# Patient Record
Sex: Female | Born: 1960 | Race: White | Hispanic: No | Marital: Married | Smoking: Former smoker
Health system: Southern US, Community
[De-identification: ages and names within clinical notes are randomized; demographics above are authoritative.]

## PROBLEM LIST (undated history)

## (undated) DIAGNOSIS — K259 Gastric ulcer, unspecified as acute or chronic, without hemorrhage or perforation: Secondary | ICD-10-CM

## (undated) DIAGNOSIS — K5790 Diverticulosis of intestine, part unspecified, without perforation or abscess without bleeding: Secondary | ICD-10-CM

## (undated) DIAGNOSIS — E079 Disorder of thyroid, unspecified: Secondary | ICD-10-CM

## (undated) DIAGNOSIS — I350 Nonrheumatic aortic (valve) stenosis: Secondary | ICD-10-CM

## (undated) DIAGNOSIS — A6920 Lyme disease, unspecified: Secondary | ICD-10-CM

## (undated) DIAGNOSIS — F419 Anxiety disorder, unspecified: Secondary | ICD-10-CM

## (undated) DIAGNOSIS — M1990 Unspecified osteoarthritis, unspecified site: Secondary | ICD-10-CM

## (undated) DIAGNOSIS — E7800 Pure hypercholesterolemia, unspecified: Secondary | ICD-10-CM

## (undated) DIAGNOSIS — E539 Vitamin B deficiency, unspecified: Secondary | ICD-10-CM

## (undated) DIAGNOSIS — M341 CR(E)ST syndrome: Secondary | ICD-10-CM

## (undated) HISTORY — DX: Gastric ulcer, unspecified as acute or chronic, without hemorrhage or perforation: K25.9

## (undated) HISTORY — DX: Pure hypercholesterolemia, unspecified: E78.00

## (undated) HISTORY — DX: Diverticulosis of intestine, part unspecified, without perforation or abscess without bleeding: K57.90

## (undated) HISTORY — DX: Vitamin B deficiency, unspecified: E53.9

## (undated) HISTORY — DX: Anxiety disorder, unspecified: F41.9

## (undated) HISTORY — DX: Nonrheumatic aortic (valve) stenosis: I35.0

## (undated) HISTORY — DX: Lyme disease, unspecified: A69.20

## (undated) HISTORY — DX: Unspecified osteoarthritis, unspecified site: M19.90

## (undated) HISTORY — DX: Cr(e)st syndrome: M34.1

## (undated) HISTORY — PX: TOTAL SHOULDER ARTHROPLASTY: SHX126

## (undated) HISTORY — PX: KNEE ARTHROPLASTY: SHX992

---

## 2002-07-19 HISTORY — PX: OOPHORECTOMY: SHX86

## 2005-07-19 HISTORY — PX: KNEE SURGERY: SHX244

## 2011-12-04 ENCOUNTER — Emergency Department
Admit: 2011-12-04 | Discharge: 2011-12-04 | Disposition: A | Discharge status: Home / Self Care | Payer: BC Managed Care – PPO

## 2011-12-04 ENCOUNTER — Encounter: Payer: Self-pay | Admitting: *Deleted

## 2011-12-04 ENCOUNTER — Emergency Department (INDEPENDENT_AMBULATORY_CARE_PROVIDER_SITE_OTHER)
Admission: EM | Admit: 2011-12-04 | Discharge: 2011-12-04 | Disposition: A | Discharge status: Home / Self Care | Payer: BC Managed Care – PPO | Source: Home / Self Care | Attending: Emergency Medicine | Admitting: Emergency Medicine

## 2011-12-04 DIAGNOSIS — M533 Sacrococcygeal disorders, not elsewhere classified: Secondary | ICD-10-CM

## 2011-12-04 NOTE — ED Provider Notes (Signed)
History     CSN: [id number]  Arrival date & time 12/04/11  1051   First MD Initiated Contact with Patient 12/04/11 1059      No chief complaint on file.   (Consider location/radiation/quality/duration/timing/severity/associated sxs/prior treatment) HPI Patient presents today with tailbone pain.  She fell off a horse and landed on her butt.  She felt some pain.  She is postmenopausal.  This has happened in the past and she had x-rays which turned out to be negative.  She recalls at that soreness did last for a little bit.  She has been using ibuprofen and Tylenol which has been helping.  She describes it as a soreness around her tailbone and in her buttock area.  She's been up around normally and walk, however standing up from sitting does cause some discomfort.  It is constant.  No past medical history on file.  No past surgical history on file.  No family history on file.  History  Substance Use Topics  . Smoking status: Not on file  . Smokeless tobacco: Not on file  . Alcohol Use: Not on file    OB History    No data available      Review of Systems  All other systems reviewed and are negative.    Allergies  Review of patient's allergies indicates not on file.  Home Medications  No current outpatient prescriptions on file.  There were no vitals taken for this visit.  Physical Exam  Nursing note and vitals reviewed. Constitutional: She is oriented to person, place, and time. She appears well-developed and well-nourished.  HENT:  Head: Normocephalic and atraumatic.  Eyes: No scleral icterus.  Neck: Neck supple.  Cardiovascular: Regular rhythm and normal heart sounds.   Pulmonary/Chest: Effort normal and breath sounds normal. No respiratory distress.  Musculoskeletal:       Lumbar back: She exhibits bony tenderness and pain. She exhibits normal range of motion, no tenderness, no edema, no deformity, no laceration and no spasm.       SLR negative.  Pain to  palpation sacral area and mildly bilateral sciatic notch.    Neurological: She is alert and oriented to person, place, and time.  Skin: Skin is warm and dry.  Psychiatric: She has a normal mood and affect. Her speech is normal.    ED Course  Procedures (including critical care time)  Labs Reviewed - No data to display Dg Sacrum/coccyx  12/04/2011  *RADIOLOGY REPORT*  Clinical Data: Fall from horse.  Sacral pain.  SACRUM AND COCCYX - 2+ VIEW  Comparison: None.  Findings: The sacrum and coccyx are intact.  No acute bone or soft tissue abnormality is present.  Mild degenerative changes are noted in the lumbar spine at L4-5 and L5-S1.  IMPRESSION:  1.  No acute abnormality of the sacrum and coccyx. 2.  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  Original Report Authenticated By: Jamesetta Orleans. MATTERN, M.D.     1. Coccydynia       MDM   An x-ray was obtained and read by the radiologist as above.  I discussed the diagnosis of coccydynia patient and explained the treatment protocol.  She can continue with her ibuprofen and Tylenol, continue to ice, consider a soft padded seat or donut.  If the pain has lingered long time, she can followup with pain management who may consider doing a injection and/or referral to PT.  Marlaine Hind, MD 12/04/11 1154

## 2011-12-11 ENCOUNTER — Emergency Department (INDEPENDENT_AMBULATORY_CARE_PROVIDER_SITE_OTHER)
Admission: EM | Admit: 2011-12-11 | Discharge: 2011-12-11 | Disposition: A | Discharge status: Home / Self Care | Payer: BC Managed Care – PPO | Source: Home / Self Care

## 2011-12-11 ENCOUNTER — Encounter: Payer: Self-pay | Admitting: Emergency Medicine

## 2011-12-11 DIAGNOSIS — N390 Urinary tract infection, site not specified: Secondary | ICD-10-CM

## 2011-12-11 HISTORY — DX: Disorder of thyroid, unspecified: E07.9

## 2011-12-11 LAB — POCT URINALYSIS DIP (MANUAL ENTRY)
Nitrite, UA: POSITIVE
Protein Ur, POC: 30
Urobilinogen, UA: 0.2 (ref 0–1)
pH, UA: 6 (ref 5–8)

## 2011-12-11 MED ORDER — CEPHALEXIN 500 MG PO CAPS: 500.0000 mg | ORAL_CAPSULE | Freq: Three times a day (TID) | ORAL | Status: AC

## 2011-12-11 NOTE — Discharge Instructions (Signed)

## 2011-12-11 NOTE — ED Provider Notes (Signed)
History     CSN: [id number]  Arrival date & time 12/11/11  1158   First MD Initiated Contact with Patient 12/11/11 1235      Chief Complaint  Patient presents with  . Dysuria   Patient is a 51 y.o. female presenting with dysuria. The history is provided by the patient.  Dysuria  This is a new problem. The current episode started yesterday. The problem occurs every urination. The problem has not changed since onset.The quality of the pain is described as burning. The pain is mild. There has been no fever. She is sexually active. There is no history of pyelonephritis. Associated symptoms include frequency, hesitancy and urgency. Pertinent negatives include no chills, no sweats, no nausea, no vomiting, no discharge, no hematuria and no flank pain. She has tried nothing for the symptoms. Her past medical history does not include kidney stones, single kidney or recurrent UTIs.    Past Medical History  Diagnosis Date  . Thyroid disease     Past Surgical History  Procedure Date  . Knee surgery   . Oophorectomy     History reviewed. No pertinent family history.  History  Substance Use Topics  . Smoking status: Never Smoker   . Smokeless tobacco: Not on file  . Alcohol Use: Yes    OB History    Grav Para Term Preterm Abortions TAB SAB Ect Mult Living                  Review of Systems  Constitutional: Negative for chills.  Gastrointestinal: Negative for nausea and vomiting.  Genitourinary: Positive for dysuria, hesitancy, urgency and frequency. Negative for hematuria and flank pain.    Allergies  Celebrex and Vioxx  Home Medications   Current Outpatient Rx  Name Route Sig Dispense Refill  . CEPHALEXIN 500 MG PO CAPS Oral Take 1 capsule (500 mg total) by mouth 3 (three) times daily. 21 capsule 0  . ESTROGENS CONJ SYNTHETIC A 0.3 MG PO TABS Oral Take 0.3 mg by mouth daily. Specific dosage unknown    . LEVOTHYROXINE SODIUM 150 MCG PO TABS Oral Take 150 mcg by mouth  daily.    Marland Kitchen LIOTHYRONINE SODIUM 25 MCG PO TABS Oral Take 25 mcg by mouth daily.      BP 156/86  Pulse 72  Temp(Src) 97.4 F (36.3 C) (Oral)  Resp 16  SpO2 98%  Physical Exam  Constitutional: She appears well-developed.  HENT:  Head: Normocephalic and atraumatic.  Mouth/Throat: Oropharynx is clear and moist.  Eyes: EOM are normal. Pupils are equal, round, and reactive to light.  Neck: Normal range of motion. Neck supple.  Cardiovascular: Normal rate and regular rhythm.   Pulmonary/Chest: Effort normal and breath sounds normal. She exhibits tenderness.  Abdominal: Soft. Bowel sounds are normal.       + suprapubic tenderness to palpation   Musculoskeletal: Normal range of motion.  Neurological: She is alert.  Skin: Skin is warm.    ED Course  Procedures (including critical care time)   Labs Reviewed  POCT URINALYSIS DIP (MANUAL ENTRY)  URINE CULTURE   No results found.   No diagnosis found.    MDM  Will treat for UTI.  Keflex x 7 days  Urine culture Discussed infectious red flags.  Handout given.  Follow up as needed.      The patient and/or caregiver has been counseled thoroughly with regard to treatment plan and/or medications prescribed including dosage, schedule, interactions, rationale for use, and possible  side effects and they verbalize understanding. Diagnoses and expected course of recovery discussed and will return if not improved as expected or if the condition worsens. Patient and/or caregiver verbalized understanding.             Floydene Flock, MD 12/11/11 1249

## 2011-12-11 NOTE — ED Provider Notes (Signed)
Agree with exam, assessment, and plan.   Lattie Haw, MD 12/11/11 [phone]

## 2011-12-11 NOTE — ED Notes (Signed)
Dysuria x 'couple days'. Recently treated for musculo-skeletal injury.

## 2011-12-14 ENCOUNTER — Telehealth: Payer: Self-pay | Admitting: *Deleted

## 2011-12-14 NOTE — ED Notes (Signed)
Spoke to pt given urine culture results. She states that she is feeling better. Advised her to complete all of the ABT and to f/u with her PCP if she fails to improve or worsens. Pt agrees.

## 2012-01-20 ENCOUNTER — Encounter: Payer: Self-pay | Admitting: *Deleted

## 2012-01-20 ENCOUNTER — Emergency Department (INDEPENDENT_AMBULATORY_CARE_PROVIDER_SITE_OTHER)
Admission: EM | Admit: 2012-01-20 | Discharge: 2012-01-20 | Disposition: A | Discharge status: Home / Self Care | Payer: BC Managed Care – PPO | Source: Home / Self Care | Attending: Family Medicine | Admitting: Family Medicine

## 2012-01-20 DIAGNOSIS — R300 Dysuria: Secondary | ICD-10-CM

## 2012-01-20 DIAGNOSIS — N3000 Acute cystitis without hematuria: Secondary | ICD-10-CM

## 2012-01-20 LAB — POCT URINALYSIS DIP (MANUAL ENTRY)
Bilirubin, UA: NEGATIVE
Ketones, POC UA: NEGATIVE
Spec Grav, UA: 1.01 (ref 1.005–1.03)
pH, UA: 6 (ref 5–8)

## 2012-01-20 MED ORDER — CIPROFLOXACIN HCL 250 MG PO TABS: 250.0000 mg | ORAL_TABLET | Freq: Two times a day (BID) | ORAL | Status: AC

## 2012-01-20 NOTE — ED Provider Notes (Signed)
History     CSN: [id number]  Arrival date & time 01/20/12  4098   First MD Initiated Contact with Patient 01/20/12 [phone]      Chief Complaint  Patient presents with  . Dysuria      HPI Comments: Patient notes that previous UTI symptoms resolved.  Now has recurrent symptoms for two days.  Patient is a 51 y.o. female presenting with dysuria. The history is provided by the patient.  Dysuria  This is a recurrent problem. The current episode started yesterday. The problem occurs every urination. The problem has not changed since onset.The quality of the pain is described as burning. The pain is at a severity of 0/10. The pain is mild. There has been no fever. There is no history of pyelonephritis. Associated symptoms include frequency, hesitancy and urgency. Pertinent negatives include no chills, no sweats, no nausea, no vomiting, no discharge, no hematuria, no possible pregnancy and no flank pain. She has tried nothing for the symptoms. Her past medical history does not include recurrent UTIs.    Past Medical History  Diagnosis Date  . Thyroid disease     Past Surgical History  Procedure Date  . Knee surgery   . Oophorectomy     History reviewed. No pertinent family history.  History  Substance Use Topics  . Smoking status: Never Smoker   . Smokeless tobacco: Not on file  . Alcohol Use: Yes    OB History    Grav Para Term Preterm Abortions TAB SAB Ect Mult Living                  Review of Systems  Constitutional: Negative for chills.  Gastrointestinal: Negative for nausea and vomiting.  Genitourinary: Positive for dysuria, hesitancy, urgency and frequency. Negative for hematuria and flank pain.  All other systems reviewed and are negative.    Allergies  Celebrex and Vioxx  Home Medications   Current Outpatient Rx  Name Route Sig Dispense Refill  . CIPROFLOXACIN HCL 250 MG PO TABS Oral Take 1 tablet (250 mg total) by mouth 2 (two) times daily. 10 tablet 0  .  ESTROGENS CONJ SYNTHETIC A 0.3 MG PO TABS Oral Take 0.3 mg by mouth daily. Specific dosage unknown    . LEVOTHYROXINE SODIUM 150 MCG PO TABS Oral Take 150 mcg by mouth daily.    Marland Kitchen LIOTHYRONINE SODIUM 25 MCG PO TABS Oral Take 25 mcg by mouth daily.      BP 129/80  Pulse 68  Temp 97.7 F (36.5 C) (Oral)  Ht 5\' 7"  (1.702 m)  Wt 178 lb 12.8 oz (81.103 kg)  BMI 28.00 kg/m2  SpO2 100%  Physical Exam Nursing notes and Vital Signs reviewed. Appearance:  Patient appears healthy, stated age, and in no acute distress Eyes:  Pupils are equal, round, and reactive to light and accomodation.  Extraocular movement is intact.  Conjunctivae are not inflamed   Mouth/Pharynx:  Normal; moist mucous membranes  Neck:  Supple.  No adenopathy Lungs:  Clear to auscultation.  Breath sounds are equal.  Heart:  Regular rate and rhythm without murmurs, rubs, or gallops.  Abdomen:   Mild tenderness over bladder without masses or hepatosplenomegaly.  Bowel sounds are present.  No CVA or flank tenderness.  Skin:  No rash present.   ED Course  Procedures none   Labs Reviewed  POCT URINALYSIS DIP (MANUAL ENTRY):  Small blood, small leuks, otherwise negative  URINE CULTURE pending      1.  Dysuria   2. Acute cystitis, recurrent       MDM  Previous cystitis with e. Coli sensitive to all.  Since recurrent, will treat with Cipro for 5 days. Urine culture pending. Increase fluid intake.  May take OTC Azo for 2 to 3 days.  Avoid sun exposure while taking antibiotic Followup with Family Doctor if not improved in one week.         Lattie Haw, MD 01/20/12 [phone]

## 2012-01-20 NOTE — ED Notes (Signed)
Patient c/o dysuria and urinary frequency x last night. Denies hematuria. No otc meds taken.

## 2012-01-22 ENCOUNTER — Telehealth: Payer: Self-pay | Admitting: Family Medicine

## 2012-03-09 ENCOUNTER — Encounter: Payer: Self-pay | Admitting: *Deleted

## 2012-03-09 ENCOUNTER — Emergency Department (INDEPENDENT_AMBULATORY_CARE_PROVIDER_SITE_OTHER)
Admission: EM | Admit: 2012-03-09 | Discharge: 2012-03-09 | Disposition: A | Discharge status: Home / Self Care | Payer: BC Managed Care – PPO | Source: Home / Self Care | Attending: Emergency Medicine | Admitting: Emergency Medicine

## 2012-03-09 DIAGNOSIS — J0100 Acute maxillary sinusitis, unspecified: Secondary | ICD-10-CM

## 2012-03-09 MED ORDER — AMOXICILLIN 875 MG PO TABS: 875.0000 mg | ORAL_TABLET | Freq: Two times a day (BID) | ORAL | Status: AC

## 2012-03-09 MED ORDER — FLUTICASONE PROPIONATE 50 MCG/ACT NA SUSP: NASAL | Status: DC

## 2012-03-09 NOTE — ED Notes (Signed)
Patient c/o HA, sinus pain/pressure and congestion x 3-4 days. Taken Claritin and Mucinex D

## 2012-03-09 NOTE — ED Provider Notes (Signed)
History     CSN: [id number]  Arrival date & time 03/09/12  0804   First MD Initiated Contact with Patient 03/09/12 [phone]      Chief Complaint  Patient presents with  . Facial Pain  . Headache     HPI SINUSITIS  Onset: 3-4 days Facial/sinus pressure with discolored nasal mucus.    Severity: moderate Tried OTC meds without significant relief.  Symptoms:  + mild Fever , chills + URI prodrome with nasal congestion + Minimal swollen neck glands + moderate Sinus Headache + minimal ear pressure  + Allergy symptoms No significant Sore Throat No eye symptoms     No significant Cough No chest pain No shortness of breath  No wheezing  No Abdominal Pain No Nausea No Vomiting No diarrhea  No Myalgias No focal neurologic symptoms No syncope No Rash  No Urinary symptoms          Past Medical History  Diagnosis Date  . Thyroid disease     Past Surgical History  Procedure Date  . Knee surgery   . Oophorectomy     History reviewed. No pertinent family history.  History  Substance Use Topics  . Smoking status: Never Smoker   . Smokeless tobacco: Not on file  . Alcohol Use: Yes    OB History    Grav Para Term Preterm Abortions TAB SAB Ect Mult Living                  Review of Systems  All other systems reviewed and are negative.    Allergies  Celebrex and Vioxx  Home Medications   Current Outpatient Rx  Name Route Sig Dispense Refill  . ESTROGENS CONJ SYNTHETIC A 0.3 MG PO TABS Oral Take 0.3 mg by mouth daily. Specific dosage unknown    . LEVOTHYROXINE SODIUM 150 MCG PO TABS Oral Take 150 mcg by mouth daily.    Marland Kitchen LIOTHYRONINE SODIUM 25 MCG PO TABS Oral Take 25 mcg by mouth daily.      BP 120/80  Pulse 77  Temp 98.1 F (36.7 C) (Oral)  Resp 14  Ht 5\' 7"  (1.702 m)  Wt 174 lb (78.926 kg)  BMI 27.25 kg/m2  SpO2 95%  Physical Exam  Nursing note and vitals reviewed. Constitutional: She is oriented to person, place, and time. She  appears well-developed and well-nourished. No distress.  HENT:  Head: Normocephalic and atraumatic.  Right Ear: Tympanic membrane, external ear and ear canal normal.  Left Ear: Tympanic membrane, external ear and ear canal normal.  Nose: Mucosal edema and rhinorrhea present. Right sinus exhibits maxillary sinus tenderness. Left sinus exhibits maxillary sinus tenderness.  Mouth/Throat: Oropharynx is clear and moist. No oral lesions. No oropharyngeal exudate.  Eyes: Right eye exhibits no discharge. Left eye exhibits no discharge. No scleral icterus.  Neck: Neck supple.  Cardiovascular: Normal rate, regular rhythm and normal heart sounds.   Pulmonary/Chest: Effort normal and breath sounds normal. She has no wheezes. She has no rales.  Lymphadenopathy:    She has no cervical adenopathy.  Neurological: She is alert and oriented to person, place, and time.  Skin: Skin is warm and dry.    ED Course  Procedures (including critical care time)    MDM  Acute maxillary sinusitis with allergy component. Questions invited and answered. Risks, benefits, alternatives discussed. Patient agrees with the following plans: Amoxicillin 875 twice a day for 10 days. Flonase. Precautions discussed. Begin Antibiotic as directed. Take Mucinex-D (guaifenesin with  decongestant) twice daily for congestion. (If your blood pressure is high, then take plain Mucinex twice daily.)  Increase fluid intake, rest. May use Afrin nasal spray (or generic oxymetazoline) twice daily for about 5 days.  Also recommend using saline nasal spray several times daily and/or saline nasal irrigation. Stop all antihistamines for now, and other non-prescription cough/cold preparations. May resume Claritin when congestion is improving. Begin fluticasone (Flonase) nasal spray as prescribed.  Suggest using the fluticasone right after using Afrin and saline irrigation. Follow-up with family doctor if not improving 7 to 10 days, sooner if  worse or new symptoms.         Lajean Manes, MD 03/09/12 [phone]

## 2012-03-19 HISTORY — PX: SHOULDER SURGERY: SHX246

## 2012-03-21 ENCOUNTER — Encounter: Payer: Self-pay | Admitting: *Deleted

## 2012-03-21 ENCOUNTER — Emergency Department (INDEPENDENT_AMBULATORY_CARE_PROVIDER_SITE_OTHER)
Admission: EM | Admit: 2012-03-21 | Discharge: 2012-03-21 | Disposition: A | Discharge status: Home / Self Care | Payer: BC Managed Care – PPO | Source: Home / Self Care | Attending: Family Medicine | Admitting: Family Medicine

## 2012-03-21 ENCOUNTER — Emergency Department
Admission: EM | Admit: 2012-03-21 | Discharge: 2012-03-21 | Discharge status: Absent without leave | Payer: BC Managed Care – PPO | Source: Home / Self Care

## 2012-03-21 DIAGNOSIS — N3000 Acute cystitis without hematuria: Secondary | ICD-10-CM

## 2012-03-21 LAB — POCT URINALYSIS DIP (MANUAL ENTRY)
Protein Ur, POC: NEGATIVE
Spec Grav, UA: 1.015 (ref 1.005–1.03)
Urobilinogen, UA: 0.2 (ref 0–1)

## 2012-03-21 MED ORDER — NITROFURANTOIN MONOHYD MACRO 100 MG PO CAPS: 100.0000 mg | ORAL_CAPSULE | Freq: Two times a day (BID) | ORAL | Status: AC

## 2012-03-21 NOTE — ED Notes (Signed)
Pt c/o dysuria and frequent urination x this morning. Denies fever. No OTC meds.

## 2012-03-21 NOTE — ED Provider Notes (Signed)
History     CSN: [id number]  Arrival date & time 03/21/12  1615   First MD Initiated Contact with Patient 03/21/12 1642      Chief Complaint  Patient presents with  . Dysuria      HPI Comments: Pt c/o dysuria and frequent urination x this morning. Denies fever. No OTC meds.  Patient is a 51 y.o. female presenting with dysuria. The history is provided by the patient.  Dysuria  This is a new problem. The current episode started 6 to 12 hours ago. The problem occurs every urination. The problem has been rapidly worsening. The quality of the pain is described as burning. The pain is mild. There has been no fever. Associated symptoms include frequency, hesitancy and urgency. Pertinent negatives include no chills, no sweats, no nausea, no vomiting, no discharge, no hematuria and no flank pain. She has tried nothing for the symptoms.    Past Medical History  Diagnosis Date  . Thyroid disease     Past Surgical History  Procedure Date  . Knee surgery   . Oophorectomy     History reviewed. No pertinent family history.  History  Substance Use Topics  . Smoking status: Never Smoker   . Smokeless tobacco: Not on file  . Alcohol Use: Yes    OB History    Grav Para Term Preterm Abortions TAB SAB Ect Mult Living                  Review of Systems  Constitutional: Negative for chills.  Gastrointestinal: Negative for nausea and vomiting.  Genitourinary: Positive for dysuria, hesitancy, urgency and frequency. Negative for hematuria and flank pain.  All other systems reviewed and are negative.    Allergies  Celebrex and Vioxx  Home Medications   Current Outpatient Rx  Name Route Sig Dispense Refill  . ESTROGENS CONJ SYNTHETIC A 0.3 MG PO TABS Oral Take 0.3 mg by mouth daily. Specific dosage unknown    . FLUTICASONE PROPIONATE 50 MCG/ACT NA SUSP  1 or 2 sprays each nostril twice a day 16 g 0  . LEVOTHYROXINE SODIUM 150 MCG PO TABS Oral Take 150 mcg by mouth daily.    Marland Kitchen  LIOTHYRONINE SODIUM 25 MCG PO TABS Oral Take 25 mcg by mouth daily.      BP 130/81  Pulse 73  Temp 97.7 F (36.5 C) (Oral)  Resp 16  Ht 5\' 7"  (1.702 m)  Wt 180 lb (81.647 kg)  BMI 28.19 kg/m2  SpO2 99%  Physical Exam Nursing notes and Vital Signs reviewed. Appearance:  Patient appears healthy, stated age, and in no acute distress Eyes:  Pupils are equal, round, and reactive to light and accomodation.  Extraocular movement is intact.  Conjunctivae are not inflamed  Pharynx:  Normal Neck:  Supple.  No adenopathy Lungs:  Clear to auscultation.  Breath sounds are equal.  Heart:  Regular rate and rhythm without murmurs, rubs, or gallops.  Abdomen:  Nontender without masses or hepatosplenomegaly.  Bowel sounds are present.  No CVA or flank tenderness.  Extremities:  No edema.  No calf tenderness Skin:  No rash present.   ED Course  Procedures none   Labs Reviewed  POCT URINALYSIS DIP (MANUAL ENTRY):  Moderate blood, moderate leuks  URINE CULTURE pending      1. Acute cystitis       MDM   Urine culture pending.  Begin Macrobid. Increase fluid intake.  If desired, may use AZO for urinary  discomfort (do not use more than two days). Followup with Family Doctor if not improved in one week.         Lattie Haw, MD 03/22/12 [phone]

## 2012-03-25 LAB — URINE CULTURE: Colony Count: 40000

## 2012-03-26 ENCOUNTER — Telehealth: Payer: Self-pay | Admitting: Emergency Medicine

## 2012-03-28 ENCOUNTER — Other Ambulatory Visit: Payer: Self-pay | Admitting: Orthopedic Surgery

## 2012-03-28 DIAGNOSIS — M25511 Pain in right shoulder: Secondary | ICD-10-CM

## 2012-03-30 ENCOUNTER — Ambulatory Visit
Admission: RE | Admit: 2012-03-30 | Discharge: 2012-03-30 | Disposition: A | Discharge status: Home / Self Care | Payer: BC Managed Care – PPO | Source: Ambulatory Visit | Attending: Orthopedic Surgery | Admitting: Orthopedic Surgery

## 2012-03-30 DIAGNOSIS — M25511 Pain in right shoulder: Secondary | ICD-10-CM

## 2012-07-08 ENCOUNTER — Encounter: Payer: Self-pay | Admitting: Emergency Medicine

## 2012-07-08 ENCOUNTER — Emergency Department (INDEPENDENT_AMBULATORY_CARE_PROVIDER_SITE_OTHER)
Admission: EM | Admit: 2012-07-08 | Discharge: 2012-07-08 | Disposition: A | Discharge status: Home / Self Care | Payer: BC Managed Care – PPO | Source: Home / Self Care | Attending: Family Medicine | Admitting: Family Medicine

## 2012-07-08 DIAGNOSIS — N390 Urinary tract infection, site not specified: Secondary | ICD-10-CM

## 2012-07-08 LAB — POCT URINALYSIS DIP (MANUAL ENTRY)
Ketones, POC UA: NEGATIVE
Protein Ur, POC: NEGATIVE
Spec Grav, UA: <=1.005
pH, UA: 5.5

## 2012-07-08 MED ORDER — CEPHALEXIN 500 MG PO CAPS: 500.0000 mg | ORAL_CAPSULE | Freq: Three times a day (TID) | ORAL | Status: DC

## 2012-07-08 NOTE — ED Provider Notes (Addendum)
History     CSN: [id number]  Arrival date & time 07/08/12  1132   None     Chief Complaint  Patient presents with  . Urinary Frequency   HPI  DYSURIA Onset:  3-4 days  Description: dysuria, increased urinary frequency Modifying factors: has had 3 UTIs thus far this year per pt.   Symptoms Urgency:  yes Frequency: yes  Hesitancy:  yes Hematuria:  no Flank Pain:  no Fever: no Nausea/Vomiting:  no Missed LMP: no STD exposure: no Discharge: no Irritants: no Rash: no  Red Flags   More than 3 UTI's last 12 months:  yes PMH of  Diabetes or Immunosuppression:  no Renal Disease/Calculi: no Urinary Tract Abnormality:  no Instrumentation or Trauma: no    Past Medical History  Diagnosis Date  . Thyroid disease     Past Surgical History  Procedure Date  . Knee surgery   . Oophorectomy     No family history on file.  History  Substance Use Topics  . Smoking status: Never Smoker   . Smokeless tobacco: Not on file  . Alcohol Use: Yes    OB History    Grav Para Term Preterm Abortions TAB SAB Ect Mult Living                  Review of Systems  All other systems reviewed and are negative.    Allergies  Celebrex and Vioxx  Home Medications   Current Outpatient Rx  Name  Route  Sig  Dispense  Refill  . ESTROGENS CONJ SYNTHETIC A 0.3 MG PO TABS   Oral   Take 0.3 mg by mouth daily. Specific dosage unknown         . FLUTICASONE PROPIONATE 50 MCG/ACT NA SUSP      1 or 2 sprays each nostril twice a day   16 g   0   . LEVOTHYROXINE SODIUM 150 MCG PO TABS   Oral   Take 150 mcg by mouth daily.         Marland Kitchen LIOTHYRONINE SODIUM 25 MCG PO TABS   Oral   Take 25 mcg by mouth daily.           BP 118/80  Pulse 80  Temp 98.1 F (36.7 C) (Oral)  Resp 16  Ht 5\' 7"  (1.702 m)  Wt 175 lb (79.379 kg)  BMI 27.41 kg/m2  SpO2 99%  Physical Exam  Constitutional: She is oriented to person, place, and time. She appears well-developed and  well-nourished.  HENT:  Head: Normocephalic and atraumatic.  Eyes: Conjunctivae normal are normal. Pupils are equal, round, and reactive to light.  Neck: Normal range of motion. Neck supple.  Cardiovascular: Normal rate, regular rhythm and normal heart sounds.   Pulmonary/Chest: Effort normal and breath sounds normal.  Abdominal: Soft.       No flank pain  + suprapubic tenderness    Musculoskeletal: Normal range of motion.  Neurological: She is alert and oriented to person, place, and time.  Skin: Skin is warm.    ED Course  Procedures (including critical care time)   Labs Reviewed  POCT URINALYSIS DIP (MANUAL ENTRY)   No results found.   1. Recurrent UTI       MDM  Will place on extended course of keflex for treatment.  Prior urine cultures reviewed.  Urine culture.  Urology referral as this has become a recurrent issue.  Discussed infectious and GU red flags.  The patient and/or caregiver has been counseled thoroughly with regard to treatment plan and/or medications prescribed including dosage, schedule, interactions, rationale for use, and possible side effects and they verbalize understanding. Diagnoses and expected course of recovery discussed and will return if not improved as expected or if the condition worsens. Patient and/or caregiver verbalized understanding.             Doree Albee, MD 07/08/12 1217  Doree Albee, MD 07/08/12 [phone]

## 2012-07-08 NOTE — ED Notes (Signed)
Reports urinary frequency and dysuria an 48 hours; this is 3 rd time this year with similar symptoms.

## 2012-07-10 ENCOUNTER — Telehealth: Payer: Self-pay | Admitting: *Deleted

## 2012-07-17 ENCOUNTER — Telehealth: Payer: Self-pay | Admitting: Emergency Medicine

## 2012-07-17 MED ORDER — CIPROFLOXACIN HCL 500 MG PO TABS: 500.0000 mg | ORAL_TABLET | Freq: Two times a day (BID) | ORAL | Status: DC

## 2013-03-20 ENCOUNTER — Other Ambulatory Visit: Payer: Self-pay | Admitting: Orthopedic Surgery

## 2013-03-20 DIAGNOSIS — M25561 Pain in right knee: Secondary | ICD-10-CM

## 2013-03-22 ENCOUNTER — Ambulatory Visit
Admission: RE | Admit: 2013-03-22 | Discharge: 2013-03-22 | Disposition: A | Discharge status: Home / Self Care | Payer: Self-pay | Source: Ambulatory Visit | Attending: Orthopedic Surgery | Admitting: Orthopedic Surgery

## 2013-03-22 DIAGNOSIS — M25561 Pain in right knee: Secondary | ICD-10-CM

## 2013-09-15 ENCOUNTER — Emergency Department (INDEPENDENT_AMBULATORY_CARE_PROVIDER_SITE_OTHER)
Admission: EM | Admit: 2013-09-15 | Discharge: 2013-09-15 | Disposition: A | Discharge status: Home / Self Care | Payer: BC Managed Care – PPO | Source: Home / Self Care | Attending: Family Medicine | Admitting: Family Medicine

## 2013-09-15 ENCOUNTER — Emergency Department (INDEPENDENT_AMBULATORY_CARE_PROVIDER_SITE_OTHER): Payer: BC Managed Care – PPO

## 2013-09-15 ENCOUNTER — Encounter: Payer: Self-pay | Admitting: Emergency Medicine

## 2013-09-15 DIAGNOSIS — J3489 Other specified disorders of nose and nasal sinuses: Secondary | ICD-10-CM

## 2013-09-15 DIAGNOSIS — J309 Allergic rhinitis, unspecified: Secondary | ICD-10-CM

## 2013-09-15 DIAGNOSIS — E079 Disorder of thyroid, unspecified: Secondary | ICD-10-CM | POA: Insufficient documentation

## 2013-09-15 DIAGNOSIS — R51 Headache: Secondary | ICD-10-CM

## 2013-09-15 MED ORDER — CEFDINIR 300 MG PO CAPS: 300.0000 mg | ORAL_CAPSULE | Freq: Two times a day (BID) | ORAL | Status: DC

## 2013-09-15 MED ORDER — PREDNISONE 20 MG PO TABS: 20.0000 mg | ORAL_TABLET | Freq: Two times a day (BID) | ORAL | Status: DC

## 2013-09-15 MED ORDER — FLUTICASONE PROPIONATE 50 MCG/ACT NA SUSP: NASAL | Status: DC

## 2013-09-15 NOTE — ED Notes (Signed)
Paula [name] complains of pressure in face, headaches and congestion for 1 month. She was seen last week by a PA and was treated with Augmentin 125 mg and prednisone. She is not getting better. She has taken Sudafed and Mucinex for her symptoms with some relief. She reports chills for a couple of days.

## 2013-09-15 NOTE — ED Provider Notes (Signed)
CSN: [id number]     Arrival date & time 09/15/13  1345 History   First MD Initiated Contact with Patient 09/15/13 1432     Chief Complaint  Patient presents with  . Facial Pain    x 1 month  . Headache    x 1 month      HPI Comments: Patient complains of persistent sinus congestion and facial pressure for one month.  She was evaluated for sinusitis one week ago and started on Augmentin and prednisone taper.  She felt somewhat better while taking prednisone but now symptoms are persistent.  She has had aching in her maxillary teeth.  She has had chills for several days.  The history is provided by the patient.    Past Medical History  Diagnosis Date  . Thyroid disease    Past Surgical History  Procedure Laterality Date  . Oophorectomy    . Knee surgery    . Shoulder surgery     History reviewed. No pertinent family history. History  Substance Use Topics  . Smoking status: Never Smoker   . Smokeless tobacco: Never Used  . Alcohol Use: Yes   OB History   Grav Para Term Preterm Abortions TAB SAB Ect Mult Living                 Review of Systems No sore throat No cough No pleuritic pain No wheezing + nasal congestion + post-nasal drainage + sinus pain/pressure No itchy/red eyes No earache No hemoptysis No SOB No fever, + chills No nausea No vomiting No abdominal pain No diarrhea No urinary symptoms No skin rash No fatigue No myalgias + headache Used OTC meds without relief  Allergies  Celebrex and Vioxx  Home Medications   Current Outpatient Rx  Name  Route  Sig  Dispense  Refill  . cefdinir (OMNICEF) 300 MG capsule   Oral   Take 1 capsule (300 mg total) by mouth 2 (two) times daily.   14 capsule   0   . ciprofloxacin (CIPRO) 500 MG tablet   Oral   Take 1 tablet (500 mg total) by mouth 2 (two) times daily.   10 tablet   0   . estrogens conjugated, synthetic A, (CENESTIN) 0.3 MG tablet   Oral   Take 0.3 mg by mouth daily. Specific dosage  unknown         . EXPIRED: fluticasone (FLONASE) 50 MCG/ACT nasal spray      1 or 2 sprays each nostril twice a day   16 g   0   . fluticasone (FLONASE) 50 MCG/ACT nasal spray      Place two sprays in each nostril once daily   16 g   1   . guaiFENesin (MUCINEX) 600 MG 12 hr tablet   Oral   Take by mouth 2 (two) times daily.         Marland Kitchen. levothyroxine (SYNTHROID, LEVOTHROID) 150 MCG tablet   Oral   Take 150 mcg by mouth daily.         Marland Kitchen. levothyroxine (SYNTHROID, LEVOTHROID) 75 MCG tablet   Oral   Take 75 mcg by mouth daily before breakfast.         . liothyronine (CYTOMEL) 25 MCG tablet   Oral   Take 25 mcg by mouth daily.         . predniSONE (DELTASONE) 20 MG tablet   Oral   Take 1 tablet (20 mg total) by mouth 2 (two) times  daily. Take with food.   10 tablet   0   . pseudoephedrine (SUDAFED) 30 MG tablet   Oral   Take 30 mg by mouth every 4 (four) hours as needed for congestion.          BP 157/87  Pulse 77  Temp(Src) 98 F (36.7 C) (Oral)  Ht 5\' 7"  (1.702 m)  Wt 181 lb (82.101 kg)  BMI 28.34 kg/m2  SpO2 99% Physical Exam Nursing notes and Vital Signs reviewed. Appearance:  Patient appears healthy, stated age, and in no acute distress Eyes:  Pupils are equal, round, and reactive to light and accomodation.  Extraocular movement is intact.  Conjunctivae are not inflamed  Ears:  Canals normal.  Tympanic membranes normal.  Nose:  Mildly congested turbinates.   Maxillary & frontal sinus tenderness is present.  Pharynx:  Normal Neck:  Supple.  Slightly tender shotty posterior nodes are palpated bilaterally  Lungs:  Clear to auscultation.  Breath sounds are equal.  Heart:  Regular rate and rhythm without murmurs, rubs, or gallops.  Skin:  No rash present.   ED Course  Procedures  none    Imaging Review Narrative: CLINICAL DATA: Slurred is congestion. Facial pain.  EXAM: PARANASAL SINUSES - COMPLETE 3 + VIEW  COMPARISON: None.  FINDINGS: The  paranasal sinus are aerated. There is no evidence of sinus opacification air-fluid levels or mucosal thickening. No significant bone abnormalities are seen.  IMPRESSION: Negative sinus radiographs.   Electronically Signed By: Gennette Pac M.D. On: 09/15/2013 15:18    MDM   1. Allergic rhinitis    Begin prednisone burst.  Begin empiric Omnicef.  Begin Flonase. May continue Claritin D Take plain Mucinex (1200 mg guaifenesin) twice daily for cough and congestion.  Increase fluid intake. May use Afrin nasal spray (or generic oxymetazoline) twice daily for about 5 days.  Also recommend using saline nasal spray several times daily and saline nasal irrigation (AYR is a common brand).  Use Flonase spray after Afrin spray and saline rinse. Followup with ENT if not improved one month.    Lattie Haw, MD 09/17/13 2053

## 2013-09-15 NOTE — Discharge Instructions (Signed)
May continue Claritin D Take plain Mucinex (1200 mg guaifenesin) twice daily for cough and congestion.  Increase fluid intake. May use Afrin nasal spray (or generic oxymetazoline) twice daily for about 5 days.  Also recommend using saline nasal spray several times daily and saline nasal irrigation (AYR is a common brand).  Use Flonase spray after Afrin spray and saline rinse.

## 2013-09-17 ENCOUNTER — Encounter: Payer: Self-pay | Admitting: Emergency Medicine

## 2013-09-26 ENCOUNTER — Encounter: Payer: Self-pay | Admitting: Gastroenterology

## 2013-10-15 ENCOUNTER — Other Ambulatory Visit: Payer: Self-pay | Admitting: Allergy

## 2013-10-15 ENCOUNTER — Other Ambulatory Visit: Payer: Self-pay

## 2013-10-15 DIAGNOSIS — J329 Chronic sinusitis, unspecified: Secondary | ICD-10-CM

## 2013-10-17 ENCOUNTER — Ambulatory Visit
Admission: RE | Admit: 2013-10-17 | Discharge: 2013-10-17 | Disposition: A | Discharge status: Home / Self Care | Payer: BC Managed Care – PPO | Source: Ambulatory Visit | Attending: Allergy | Admitting: Allergy

## 2013-10-17 DIAGNOSIS — J329 Chronic sinusitis, unspecified: Secondary | ICD-10-CM

## 2013-12-03 ENCOUNTER — Encounter (HOSPITAL_BASED_OUTPATIENT_CLINIC_OR_DEPARTMENT_OTHER): Payer: Self-pay | Admitting: Emergency Medicine

## 2013-12-03 ENCOUNTER — Emergency Department (HOSPITAL_BASED_OUTPATIENT_CLINIC_OR_DEPARTMENT_OTHER)
Admission: EM | Admit: 2013-12-03 | Discharge: 2013-12-03 | Disposition: A | Discharge status: Home / Self Care | Payer: BC Managed Care – PPO | Attending: Emergency Medicine | Admitting: Emergency Medicine

## 2013-12-03 DIAGNOSIS — Z79899 Other long term (current) drug therapy: Secondary | ICD-10-CM | POA: Insufficient documentation

## 2013-12-03 DIAGNOSIS — H538 Other visual disturbances: Secondary | ICD-10-CM | POA: Insufficient documentation

## 2013-12-03 DIAGNOSIS — IMO0002 Reserved for concepts with insufficient information to code with codable children: Secondary | ICD-10-CM | POA: Insufficient documentation

## 2013-12-03 DIAGNOSIS — R011 Cardiac murmur, unspecified: Secondary | ICD-10-CM | POA: Insufficient documentation

## 2013-12-03 DIAGNOSIS — J309 Allergic rhinitis, unspecified: Secondary | ICD-10-CM | POA: Insufficient documentation

## 2013-12-03 DIAGNOSIS — Z792 Long term (current) use of antibiotics: Secondary | ICD-10-CM | POA: Insufficient documentation

## 2013-12-03 DIAGNOSIS — G479 Sleep disorder, unspecified: Secondary | ICD-10-CM | POA: Insufficient documentation

## 2013-12-03 DIAGNOSIS — E079 Disorder of thyroid, unspecified: Secondary | ICD-10-CM | POA: Insufficient documentation

## 2013-12-03 NOTE — ED Provider Notes (Signed)
CSN: [id number]     Arrival date & time 12/03/13  1435 History  This chart was scribed for Paula BuccoMelanie Tonika Eden, MD by Ardelia Memsylan Malpass, ED Scribe. This patient was seen in room MH10/MH10 and the patient's care was started at 3:12 PM.   Chief Complaint  Patient presents with  . Blurred Vision    The history is provided by the patient. No language interpreter was used.    HPI Comments: [name] is a 53 y.o. female who presents to the Emergency Department complaining of a sudden onset of blurred vision in her left eye only that occurred at about 1:00 PM today while driving. She reports that she had to pull over her car at this time. She reports that this symptom lasted approximately 20-30 seconds and has since resolved. She reports associated dizziness at the time she was having blurred vision. She describes this dizziness as a "spinning" sensation, and states that this dizziness was improved by looking down and closing her eyes. Afterward, she had a very mild left side headache lasting about an hour and some mild tingling sensation around her left eye. She reports that after this episode she was able to eat lunch without symptoms. She currently has no symptoms.  She reports that she called her PCP today to be evaluated for her symptoms, and he advised her to instead visit the ED. She states that she has been having allergy symptoms recently. She also notes that she did not sleep well last night. She denies any history of migraines or recurent headaches. She denies any history of CVA or TIA. However, she states that her mother has had several TIAs, and maternal grandfather had 2 massive CVAs. She denies any associated nausea, eye pain, sinus pressure, numbness/weakness/paresthesias in extremities, chest pain, SOB or fever.   Past Medical History  Diagnosis Date  . Thyroid disease    Past Surgical History  Procedure Laterality Date  . Oophorectomy    . Knee surgery    . Shoulder surgery     History  reviewed. No pertinent family history. History  Substance Use Topics  . Smoking status: Never Smoker   . Smokeless tobacco: Never Used  . Alcohol Use: Yes   OB History   Grav Para Term Preterm Abortions TAB SAB Ect Mult Living                 Review of Systems  Constitutional: Negative for fever, chills, diaphoresis and fatigue.  HENT: Negative for congestion, rhinorrhea, sinus pressure and sneezing.   Eyes: Positive for visual disturbance. Negative for pain.  Respiratory: Negative for cough, chest tightness and shortness of breath.   Cardiovascular: Negative for chest pain and leg swelling.  Gastrointestinal: Negative for nausea, vomiting, abdominal pain, diarrhea and blood in stool.  Genitourinary: Negative for frequency, hematuria, flank pain and difficulty urinating.  Musculoskeletal: Negative for arthralgias and back pain.  Skin: Negative for rash.  Allergic/Immunologic: Positive for environmental allergies.  Neurological: Positive for dizziness and headaches. Negative for speech difficulty, weakness and numbness.  Psychiatric/Behavioral: Positive for sleep disturbance.    Allergies  Celebrex and Vioxx  Home Medications   Prior to Admission medications   Medication Sig Start Date End Date Taking? Authorizing Provider  cefdinir (OMNICEF) 300 MG capsule Take 1 capsule (300 mg total) by mouth 2 (two) times daily. 09/15/13   Lattie HawStephen A Beese, MD  ciprofloxacin (CIPRO) 500 MG tablet Take 1 tablet (500 mg total) by mouth 2 (two) times daily. 07/17/12  Lattie Haw, MD  estrogens conjugated, synthetic A, (CENESTIN) 0.3 MG tablet Take 0.3 mg by mouth daily. Specific dosage unknown    Historical Provider, MD  fluticasone [facility]) 50 MCG/ACT nasal spray 1 or 2 sprays each nostril twice a day 03/09/12 03/09/13  Lajean Manes, MD  fluticasone [facility]) 50 MCG/ACT nasal spray Place two sprays in each nostril once daily 09/15/13   Lattie Haw, MD  guaiFENesin (MUCINEX) 600 MG 12 hr  tablet Take by mouth 2 (two) times daily.    Historical Provider, MD  levothyroxine (SYNTHROID, LEVOTHROID) 150 MCG tablet Take 150 mcg by mouth daily.    Historical Provider, MD  levothyroxine (SYNTHROID, LEVOTHROID) 75 MCG tablet Take 75 mcg by mouth daily before breakfast.    Historical Provider, MD  liothyronine (CYTOMEL) 25 MCG tablet Take 25 mcg by mouth daily.    Historical Provider, MD  predniSONE (DELTASONE) 20 MG tablet Take 1 tablet (20 mg total) by mouth 2 (two) times daily. Take with food. 09/15/13   Lattie Haw, MD  pseudoephedrine (SUDAFED) 30 MG tablet Take 30 mg by mouth every 4 (four) hours as needed for congestion.    Historical Provider, MD   Triage Vitals: BP 160/67  Pulse 87  Temp(Src) 98 F (36.7 C) (Oral)  Resp 16  Ht 5\' 7"  (1.702 m)  Wt 181 lb (82.101 kg)  BMI 28.34 kg/m2  SpO2 100%  Physical Exam  Nursing note and vitals reviewed. Constitutional: She is oriented to person, place, and time. She appears well-developed and well-nourished.  HENT:  Head: Normocephalic and atraumatic.  Eyes: Pupils are equal, round, and reactive to light.  Neck: Normal range of motion. Neck supple.  Cardiovascular: Normal rate and regular rhythm.   Murmur heard.  Systolic murmur is present with a grade of 2/6  Pulmonary/Chest: Effort normal and breath sounds normal. No respiratory distress. She has no wheezes. She has no rales. She exhibits no tenderness.  Abdominal: Soft. Bowel sounds are normal. There is no tenderness. There is no rebound and no guarding.  Musculoskeletal: Normal range of motion. She exhibits no edema.  Lymphadenopathy:    She has no cervical adenopathy.  Neurological: She is alert and oriented to person, place, and time. She has normal strength. No cranial nerve deficit or sensory deficit.  No nystagmus. No pronator drift. Finger to nose intact. Gait normal.  Skin: Skin is warm and dry. No rash noted.  Psychiatric: She has a normal mood and affect.   ED  Course  Procedures (including critical care time)  DIAGNOSTIC STUDIES: Oxygen Saturation is 100% on RA, normal by my interpretation.    COORDINATION OF CARE: 3:24 PM- Pt advised of plan for treatment and pt agrees.  Labs Review Labs Reviewed - No data to display  Imaging Review No results found.   EKG Interpretation None      MDM   Final diagnoses:  Blurred vision    Patient had single episode of blurred vision lasting about 20-30 seconds. She has no other neurologic deficits other than some tingling around her eyes but lasted for about an hour. She's completely asymptomatic now. She had no symptoms that would be more suggestive of TIA/CVA. I did not feel that a CT scan of her head would be beneficial given that she has no current symptoms. I also did have a strong feeling that she needed an MRI or inpatient TIA workup given the brevity of the symptoms. I discussed the findings with her primary  care physician. She agrees that the patient can have an outpatient workup. She will call the patient tomorrow to schedule further workup. Advised patient she has any further symptoms to return to the emergency room.   I personally performed the services described in this documentation, which was scribed in my presence.  The recorded information has been reviewed and considered.   Paula BuccoMelanie Akesha Uresti, MD 12/03/13 [phone]

## 2013-12-03 NOTE — ED Notes (Signed)
Patient states she needs to leave, service recovery attempted.

## 2013-12-03 NOTE — ED Notes (Signed)
Pt c/o sudden onset of blurred vision  That lasted 20 secs followed by a mild left sided h/a

## 2013-12-03 NOTE — ED Notes (Signed)
MD at bedside. 

## 2013-12-03 NOTE — Discharge Instructions (Signed)
Visual Disturbances  You have had a disturbance in your vision. This may be caused by various conditions, such as:   Migraines. Migraine headaches are often preceded by a disturbance in vision. Blind spots or light flashes are followed by a headache. This type of visual disturbance is temporary. It does not damage the eye.   Glaucoma. This is caused by increased pressure in the eye. Symptoms include haziness, blurred vision, or seeing rainbow colored circles when looking at bright lights. Partial or complete visual loss can occur. You may or may not experience eye pain. Visual loss may be gradual or sudden and is irreversible. Glaucoma is the leading cause of blindness.   Retina problems. Vision will be reduced if the retina becomes detached or if there is a circulation problem as with diabetes, high blood pressure, or a mini-stroke. Symptoms include seeing "floaters," flashes of light, or shadows, as if a curtain has fallen over your eye.   Optic nerve problems. The main nerve in your eye can be damaged by redness, soreness, and swelling (inflammation), poor circulation, drugs, and toxins.  It is very important to have a complete exam done by a specialist to determine the exact cause of your eye problem. The specialist may recommend medicines or surgery, depending on the cause of the problem. This can help prevent further loss of vision or reduce the risk of having a stroke. Contact the caregiver to whom you have been referred and arrange for follow-up care right away.  SEEK IMMEDIATE MEDICAL CARE IF:    Your vision gets worse.   You develop severe headaches.   You have any weakness or numbness in the face, arms, or legs.   You have any trouble speaking or walking.  Document Released: 08/12/2004 Document Revised: 09/27/2011 Document Reviewed: 12/03/2009  Exit[employer] Patient Information 2014 [employer].

## 2013-12-04 ENCOUNTER — Encounter: Payer: Self-pay | Admitting: Gastroenterology

## 2013-12-06 ENCOUNTER — Other Ambulatory Visit: Payer: Self-pay | Admitting: Internal Medicine

## 2013-12-06 DIAGNOSIS — H5462 Unqualified visual loss, left eye, normal vision right eye: Secondary | ICD-10-CM

## 2013-12-11 ENCOUNTER — Ambulatory Visit: Payer: Self-pay | Admitting: Gastroenterology

## 2013-12-12 ENCOUNTER — Encounter: Payer: Self-pay | Admitting: Gastroenterology

## 2013-12-13 ENCOUNTER — Other Ambulatory Visit: Payer: BC Managed Care – PPO

## 2013-12-14 ENCOUNTER — Ambulatory Visit
Admission: RE | Admit: 2013-12-14 | Discharge: 2013-12-14 | Disposition: A | Discharge status: Home / Self Care | Payer: BC Managed Care – PPO | Source: Ambulatory Visit | Attending: Internal Medicine | Admitting: Internal Medicine

## 2013-12-14 DIAGNOSIS — H5462 Unqualified visual loss, left eye, normal vision right eye: Secondary | ICD-10-CM

## 2013-12-18 ENCOUNTER — Ambulatory Visit (INDEPENDENT_AMBULATORY_CARE_PROVIDER_SITE_OTHER): Payer: BC Managed Care – PPO | Admitting: Surgery

## 2013-12-18 ENCOUNTER — Encounter (INDEPENDENT_AMBULATORY_CARE_PROVIDER_SITE_OTHER): Payer: Self-pay | Admitting: Surgery

## 2013-12-18 VITALS — BP 140/80 | HR 68 | Temp 98.1°F | Resp 18 | Ht 67.0 in | Wt 179.0 lb

## 2013-12-18 DIAGNOSIS — E349 Endocrine disorder, unspecified: Secondary | ICD-10-CM

## 2013-12-18 NOTE — Progress Notes (Signed)
General Surgery [facility]  Chief Complaint  Patient presents with  . New Evaluation    evaluate for possible primary hyperparathyroidism - referral from Dr. Allena Napoleon    HISTORY: Patient is a 53 year old female referred by her primary care physician for evaluation of possible primary hyperparathyroidism. Patient has been noted to have elevated serum calcium levels ranging from 10.2-11.0. Vitamin D levels are normal with supplementation. Intact PTH levels have ranged from 37 to most recently 64.9. Patient is referred for further evaluation and recommendations.  Patient has had no complications of hypercalcemia. She denies nephrolithiasis. Bone density scan is normal. She denies chronic fatigue.  No prior surgery on the head or neck. There is a family history of hypothyroidism. Patient has hypothyroidism and takes thyroid hormone supplementation. There is no family history of other endocrine neoplasms.  Past Medical History  Diagnosis Date  . Thyroid disease     Current Outpatient Prescriptions  Medication Sig Dispense Refill  . levothyroxine (SYNTHROID, LEVOTHROID) 75 MCG tablet Take 75 mcg by mouth daily before breakfast.      . liothyronine (CYTOMEL) 25 MCG tablet Take 25 mcg by mouth daily.      . fluticasone (FLONASE) 50 MCG/ACT nasal spray 1 or 2 sprays each nostril twice a day  16 g  0   No current facility-administered medications for this visit.    Allergies  Allergen Reactions  . Celebrex [Celecoxib]   . Vioxx [Rofecoxib]     History reviewed. No pertinent family history.  History   Social History  . Marital Status: Married    Spouse Name: N/A    Number of Children: N/A  . Years of Education: N/A   Social History Main Topics  . Smoking status: Never Smoker   . Smokeless tobacco: Never Used  . Alcohol Use: Yes  . Drug Use: No  . Sexual Activity:    Other Topics Concern  . None   Social History Narrative   ** Merged History  Encounter **        REVIEW OF SYSTEMS - PERTINENT POSITIVES ONLY: Denies tremor. Denies palpitations. Denies compressive symptoms. Denies nephrolithiasis. Denies fatigue. Denies bone and joint pain.  EXAM: Filed Vitals:   12/18/13 0910  BP: 140/80  Pulse: 68  Temp: 98.1 F (36.7 C)  Resp: 18    GENERAL: well-developed, well-nourished, no acute distress HEENT: normocephalic; pupils equal and reactive; sclerae clear; dentition good; mucous membranes moist NECK:  symmetric on extension; no palpable anterior or posterior cervical lymphadenopathy; no supraclavicular masses; no tenderness CHEST: clear to auscultation bilaterally without rales, rhonchi, or wheezes CARDIAC: regular rate and rhythm without significant murmur; peripheral pulses are full EXT:  non-tender without edema; no deformity NEURO: no gross focal deficits; no sign of tremor   LABORATORY RESULTS: See [facility] ([facility]L-Epic) for most recent results  RADIOLOGY RESULTS: See [facility] (CHL-Epic) for most recent results  IMPRESSION: #1 abnormal intact PTH level given mild hypercalcemia, rule out primary hyperparathyroidism #2 hypothyroidism #3 hypertension  PLAN: The patient and I discussed the above findings. I provided her with written literature to review at home.  The most recent intact PTH level is 64.9. Given the fact that her calcium levels ranging from 10.2-11.0, I would expect the intact PTH level to be suppressed in the range of 20-30. Therefore I think it is justified to evaluate for possible primary hyperparathyroidism. We will obtain a 24-hour urine collection for calcium. I'm going to ask the patient to  undergo a nuclear medicine sestamibi scan. I will contact her with the results of those studies.  We discussed the possibility of parathyroid surgery. We discussed the procedure in the hospital stay. We discussed the possible need for further exploration in the event of a second gland adenoma.  Patient understands.  Patient will undergo the above testing and we will contact her with the results.  Velora Hecklerodd M. Yanessa Hocevar, MD, FACS General & Endocrine Surgery [facility]  Primary Care Physician: Mia CreekVAUGHAN,ELIZABETH, MD

## 2013-12-18 NOTE — Patient Instructions (Signed)
Parathyroid Hormone This is a test to determine whether PTH levels are responding normally to changes in blood calcium levels. It also helps to distinguish the cause of calcium imbalances, and to evaluate parathyroid function. When calcium blood levels are higher or lower than normal, and when your caregiver may want to determine how well your parathyroid glands are working. Parathyroid hormone (PTH) helps the body maintain stable levels of calcium in the blood. It is part of a 'feedback loop' that includes calcium, PTH, vitamin D, and, to some extent, phosphate and magnesium. Conditions and diseases that disrupt this feedback loop can cause inappropriate elevations or decreases in calcium and PTH levels and lead to symptoms of hypercalcemia (raised blood levels of calcium) or hypocalcemia (low blood levels of calcium).  PTH is produced by four parathyroid glands that are located in the neck beside the thyroid gland. Normally, these glands secrete PTH into the bloodstream in response to low blood calcium levels. Parathyroid hormone then works in three ways to help raise blood calcium levels back to normal. It takes calcium from the body's bone, stimulates the activation of vitamin D in the kidney (which in turn increases the absorption of calcium from the intestines), and suppresses the excretion of calcium in the urine (while encouraging excretion of phosphate). As calcium levels begin to increase in the blood, PTH normally decreases. PREPARATION FOR TEST You should have nothing to eat or drink except for water after midnight on the day of the test or as directed by your caregiver. A blood sample is obtained by inserting a needle into a vein in the arm. NORMAL FINDINGS Conventional Normal  PTH intact (whole)  Assay includes intact PTH  Values (pg/mL)10-65  SI Units (ng/L)10-65  PTH N-terminalN-terminal  Values (pg/mL) 8-24  SI Units (ng/L)8-24  PTH C-terminal  Assay Includes  C-terminal  Values (pg/mL) 50-330  SI Units (ng/L) 50-330  Intact PTH  Midmolecule Ranges for normal findings may vary among different laboratories and hospitals. You should always check with your doctor after having lab work or other tests done to discuss the meaning of your test results and whether your values are considered within normal limits. MEANING OF TEST  Your caregiver will go over the test results with you and discuss the importance and meaning of your results, as well as treatment options and the need for additional tests if necessary. OBTAINING THE TEST RESULTS  It is your responsibility to obtain your test results. Ask the lab or department performing the test when and how you will get your results. Document Released: 08/07/2004 Document Revised: 09/27/2011 Document Reviewed: 06/16/2008 [employer] Patient Information 2014 [employer].  

## 2013-12-20 ENCOUNTER — Other Ambulatory Visit (INDEPENDENT_AMBULATORY_CARE_PROVIDER_SITE_OTHER): Payer: Self-pay | Admitting: Surgery

## 2013-12-20 LAB — CALCIUM, URINE, 24 HOUR
Calcium, 24 hour urine: 365 mg/d — ABNORMAL HIGH (ref 100–250)
Calcium, Ur: 9 mg/dL

## 2014-01-08 ENCOUNTER — Encounter (HOSPITAL_COMMUNITY)
Admission: RE | Admit: 2014-01-08 | Discharge: 2014-01-08 | Disposition: A | Discharge status: Home / Self Care | Payer: BC Managed Care – PPO | Source: Ambulatory Visit | Attending: Surgery | Admitting: Surgery

## 2014-01-08 ENCOUNTER — Encounter (HOSPITAL_COMMUNITY)
Admission: RE | Admit: 2014-01-08 | Discharge: 2014-01-08 | Disposition: A | Discharge status: Home / Self Care | Payer: BC Managed Care – PPO | Source: Ambulatory Visit | Attending: Diagnostic Radiology | Admitting: Diagnostic Radiology

## 2014-01-08 DIAGNOSIS — R6889 Other general symptoms and signs: Secondary | ICD-10-CM | POA: Diagnosis not present

## 2014-01-08 DIAGNOSIS — E349 Endocrine disorder, unspecified: Secondary | ICD-10-CM

## 2014-01-08 MED ORDER — TECHNETIUM TC 99M SESTAMIBI GENERIC - CARDIOLITE: 25.0000 | Freq: Once | INTRAVENOUS | Status: AC | PRN

## 2014-01-11 ENCOUNTER — Telehealth (INDEPENDENT_AMBULATORY_CARE_PROVIDER_SITE_OTHER): Payer: Self-pay

## 2014-01-11 NOTE — Telephone Encounter (Signed)
Pt calling for test results. Pt advised Dr Gerrit FriendsGerkin will be available next week to view test and advise follow up. Pt can be reached at [phone].

## 2014-01-14 ENCOUNTER — Telehealth (INDEPENDENT_AMBULATORY_CARE_PROVIDER_SITE_OTHER): Payer: Self-pay

## 2014-01-14 ENCOUNTER — Other Ambulatory Visit (INDEPENDENT_AMBULATORY_CARE_PROVIDER_SITE_OTHER): Payer: Self-pay

## 2014-01-14 DIAGNOSIS — E213 Hyperparathyroidism, unspecified: Secondary | ICD-10-CM

## 2014-01-14 NOTE — Telephone Encounter (Signed)
Order for u/s in epic and to ref coord to set up appt and call pt.

## 2014-01-15 ENCOUNTER — Telehealth (INDEPENDENT_AMBULATORY_CARE_PROVIDER_SITE_OTHER): Payer: Self-pay | Admitting: *Deleted

## 2014-01-15 NOTE — Telephone Encounter (Signed)
error 

## 2014-01-16 ENCOUNTER — Ambulatory Visit (INDEPENDENT_AMBULATORY_CARE_PROVIDER_SITE_OTHER): Payer: BC Managed Care – PPO | Admitting: Podiatry

## 2014-01-16 ENCOUNTER — Encounter: Payer: Self-pay | Admitting: Podiatry

## 2014-01-16 ENCOUNTER — Ambulatory Visit
Admission: RE | Admit: 2014-01-16 | Discharge: 2014-01-16 | Disposition: A | Discharge status: Home / Self Care | Payer: BC Managed Care – PPO | Source: Ambulatory Visit | Attending: Surgery | Admitting: Surgery

## 2014-01-16 ENCOUNTER — Other Ambulatory Visit: Payer: BC Managed Care – PPO

## 2014-01-16 ENCOUNTER — Telehealth (INDEPENDENT_AMBULATORY_CARE_PROVIDER_SITE_OTHER): Payer: Self-pay

## 2014-01-16 VITALS — BP 122/86 | HR 64 | Resp 12

## 2014-01-16 DIAGNOSIS — M7750 Other enthesopathy of unspecified foot: Secondary | ICD-10-CM

## 2014-01-16 DIAGNOSIS — E213 Hyperparathyroidism, unspecified: Secondary | ICD-10-CM

## 2014-01-16 NOTE — Telephone Encounter (Signed)
Pt advised of U/S result. Pt advised request will be sent to Dr Gerrit FriendsGerkin to review and advise f/u. Pt states Dr Gerrit FriendsGerkin mentioned an MRI if U/S did not show adenoma. Pt states she does not want to do MRI at this time. I advised pt this would also be sent to Dr Gerrit FriendsGerkin for consideration.

## 2014-01-16 NOTE — Progress Notes (Signed)
   Subjective:    Patient ID: Paula DanceCaroline O'hare, female    DOB: 06/11/1961, 53 y.o.   MRN: [id number]  HPI   PT. STATED NO FOOT PAIN JUST NEED NEW PAIR OF ORTHOTICS.    Review of Systems  Allergic/Immunologic: Positive for environmental allergies.  All other systems reviewed and are negative.      Objective:   Physical Exam        Assessment & Plan:

## 2014-01-17 NOTE — Progress Notes (Signed)
Subjective:     Patient ID: Paula [name], female   DOB: 07/31/1960, 53 y.o.   MRN: [id number]  HPI patient states that she did have generalized foot pain when orthotics have helped her but they have worn out   Review of Systems     Objective:   Physical Exam Neurovascular status intact with no change in health history and patient's noted to have generalized discomfort plantar aspect both feet with a flexible foot type and biomechanical dysfunction    Assessment:     Tendinitis secondary to foot structure bilateral    Plan:     H&P performed and recommended new customized orthotics to lift the arch and reduce stress. Scanned for custom orthotics

## 2014-02-01 ENCOUNTER — Ambulatory Visit (AMBULATORY_SURGERY_CENTER): Payer: Self-pay

## 2014-02-01 VITALS — Ht 67.0 in | Wt 180.4 lb

## 2014-02-01 DIAGNOSIS — Z1211 Encounter for screening for malignant neoplasm of colon: Secondary | ICD-10-CM

## 2014-02-01 MED ORDER — MOVIPREP 100 G PO SOLR: 1.0000 | Freq: Once | ORAL | Status: DC

## 2014-02-01 NOTE — Progress Notes (Signed)
No allergies to eggs or soy No past problems with anesthesia No diet/weight loss meds No home oxygen  Has email  Emmi instructions given for colonoscopy 

## 2014-02-04 ENCOUNTER — Encounter: Payer: Self-pay | Admitting: Gastroenterology

## 2014-02-05 ENCOUNTER — Other Ambulatory Visit (INDEPENDENT_AMBULATORY_CARE_PROVIDER_SITE_OTHER): Payer: Self-pay

## 2014-02-05 ENCOUNTER — Telehealth (INDEPENDENT_AMBULATORY_CARE_PROVIDER_SITE_OTHER): Payer: Self-pay

## 2014-02-05 DIAGNOSIS — E213 Hyperparathyroidism, unspecified: Secondary | ICD-10-CM

## 2014-02-05 NOTE — Progress Notes (Signed)
Quick Note:  No evidence of parathyroid adenoma on ultrasound or nuclear scan. PTH has remained in upper normal range, despite mild hypercalcemia. Recommend no surgery at this time. [facility] and then see you in the office to discuss results.  Velora Hecklerodd M. Ayra Hodgdon, MD, [facility] Office: [phone]    ______

## 2014-02-05 NOTE — Telephone Encounter (Signed)
Pt called back and notified.

## 2014-02-05 NOTE — Telephone Encounter (Signed)
LMOM for pt to call back to received attached msg from Dr Gerrit FriendsGerkin. Lab order in epic and lab slip mailed to pt.

## 2014-02-05 NOTE — Telephone Encounter (Signed)
Message copied by Joanette GulaSMITHEY, Victorio Creeden on Tue Feb 05, 2014  3:21 PM ------      Message from: Velora HecklerGERKIN, TODD M      Created: Tue Feb 05, 2014  3:14 PM       No evidence of parathyroid adenoma on ultrasound or nuclear scan.  PTH has remained in upper normal range, despite mild hypercalcemia.  Recommend no surgery at this time.  Will [facility] months and then see you in the office to discuss results.            Velora Hecklerodd M. Gerkin, MD, [facility]      [facility]      Office: [phone]                   ------

## 2014-02-08 ENCOUNTER — Ambulatory Visit (INDEPENDENT_AMBULATORY_CARE_PROVIDER_SITE_OTHER): Payer: BC Managed Care – PPO | Admitting: *Deleted

## 2014-02-08 DIAGNOSIS — M7750 Other enthesopathy of unspecified foot: Secondary | ICD-10-CM

## 2014-02-08 NOTE — Patient Instructions (Signed)

## 2014-02-08 NOTE — Progress Notes (Signed)
   Subjective:    Patient ID: Paula [name], female    DOB: 10/23/1960, 53 y.o.   MRN: [id number]  HPI PUO AND GIVEN INSTRUCTION.    Review of Systems     Objective:   Physical Exam        Assessment & Plan:

## 2014-02-15 ENCOUNTER — Ambulatory Visit (AMBULATORY_SURGERY_CENTER): Payer: BC Managed Care – PPO | Admitting: Gastroenterology

## 2014-02-15 ENCOUNTER — Encounter: Payer: Self-pay | Admitting: Gastroenterology

## 2014-02-15 VITALS — BP 123/82 | HR 68 | Temp 98.0°F | Resp 20 | Ht 67.0 in | Wt 180.0 lb

## 2014-02-15 DIAGNOSIS — Z1211 Encounter for screening for malignant neoplasm of colon: Secondary | ICD-10-CM

## 2014-02-15 MED ORDER — SODIUM CHLORIDE 0.9 % IV SOLN: 500.0000 mL | INTRAVENOUS | Status: DC

## 2014-02-15 NOTE — Op Note (Signed)
[facility] [address] [place], [id number]   COLONOSCOPY PROCEDURE REPORT  PATIENT: Paula [name], [name]  MR#: [id number] BIRTHDATE: [birth year] , 52  yrs. old GENDER: Female ENDOSCOPIST: Meryl DareMalcolm T Shanaya Schneck, MD, [facility] REFERRED XB:JYNWGNFAOBY:Elizabeth Ananias PilgrimVaughan, M.D. PROCEDURE DATE:  02/15/2014 PROCEDURE:   Colonoscopy, screening First Screening Colonoscopy - Avg.  risk and is 50 yrs.  old or older - No.  Prior Negative Screening - Now for repeat screening. N/A  History of Adenoma - Now for follow-up colonoscopy & has been > or = to 3 yrs.  N/A  Polyps Removed Today? No.  Recommend repeat exam, <10 yrs? No. ASA CLASS:   Class II INDICATIONS:average risk screening. MEDICATIONS: MAC sedation, administered by CRNA and propofol (Diprivan) 230mg  IV DESCRIPTION OF PROCEDURE:   After the risks benefits and alternatives of the procedure were thoroughly explained, informed consent was obtained.  A digital rectal exam revealed no abnormalities of the rectum.   The LB [id number]  endoscope was introduced through the anus and advanced to the cecum, which was identified by both the appendix and ileocecal valve. No adverse events experienced.   The quality of the prep was excellent, using MoviPrep  The instrument was then slowly withdrawn as the colon was fully examined.  COLON FINDINGS: Mild diverticulosis was noted in the sigmoid colon. The colon was otherwise normal.  There was no diverticulosis, inflammation, polyps or cancers unless previously stated. Retroflexed views revealed no abnormalities. The time to cecum=2 minutes 41 seconds.  Withdrawal time=8 minutes 46 seconds.  The scope was withdrawn and the procedure completed. COMPLICATIONS: There were no complications.  ENDOSCOPIC IMPRESSION: 1.   Mild diverticulosis in the sigmoid colon 2.   The colon was otherwise normal  RECOMMENDATIONS: 1.  High fiber diet with liberal fluid intake. 2.  Continue to follow colorectal cancer  screening guidelines for "routine risk" patients with a repeat colonoscopy in 10 years. There is no need for routine, screening FOBT (stool) testing for at least 5 years.  eSigned:  Meryl DareMalcolm T Gaylon Bentz, MD, [facility] 02/15/2014 8:58 AM

## 2014-02-15 NOTE — Progress Notes (Signed)
Report to PACU, RN, vss, BBS= Clear.  

## 2014-02-15 NOTE — Patient Instructions (Signed)

## 2014-02-18 ENCOUNTER — Telehealth: Payer: Self-pay | Admitting: *Deleted

## 2014-02-18 NOTE — Telephone Encounter (Signed)
  Follow up Call-  Call back number 02/15/2014  Post procedure Call Back phone  # [phone]  Permission to leave phone message Yes     Patient questions:  Do you have a fever, pain , or abdominal swelling? No. Pain Score  0 *  Have you tolerated food without any problems? Yes.    Have you been able to return to your normal activities? Yes.    Do you have any questions about your discharge instructions: Diet   no Medications  No. Follow up visit  No.  Do you have questions or concerns about your Care? No.  Actions: * If pain score is 4 or above: No action needed, pain <4.

## 2014-03-02 ENCOUNTER — Emergency Department (INDEPENDENT_AMBULATORY_CARE_PROVIDER_SITE_OTHER)
Admission: EM | Admit: 2014-03-02 | Discharge: 2014-03-02 | Disposition: A | Discharge status: Home / Self Care | Payer: BC Managed Care – PPO | Source: Home / Self Care | Attending: Family Medicine | Admitting: Family Medicine

## 2014-03-02 ENCOUNTER — Encounter: Payer: Self-pay | Admitting: Emergency Medicine

## 2014-03-02 DIAGNOSIS — J069 Acute upper respiratory infection, unspecified: Secondary | ICD-10-CM

## 2014-03-02 MED ORDER — AZITHROMYCIN 250 MG PO TABS: ORAL_TABLET | ORAL | Status: DC

## 2014-03-02 NOTE — ED Provider Notes (Signed)
CSN: [id number]     Arrival date & time 03/02/14  1255 History   First MD Initiated Contact with Patient 03/02/14 1312     Chief Complaint  Patient presents with  . Fever  . Sore Throat  . Nasal Congestion  . Hoarse      HPI Comments: Patient developed a sore throat six days ago, and now complains of soreness in her neck rather than sore throat.  She then developed sinus congestion, and now complains of facial discomfort.  She has had chills.  No cough. She has a history of allergic rhinitis and has had frequent sinusitis in the past.  The history is provided by the patient.    Past Medical History  Diagnosis Date  . Thyroid disease    Past Surgical History  Procedure Laterality Date  . Oophorectomy    . Knee surgery    . Shoulder surgery     Family History  Problem Relation Age of Onset  . Colon cancer Neg Hx   . Pancreatic cancer Neg Hx   . Rectal cancer Neg Hx   . Stomach cancer Neg Hx    History  Substance Use Topics  . Smoking status: Never Smoker   . Smokeless tobacco: Never Used  . Alcohol Use: Yes     Comment: once weekly   OB History   Grav Para Term Preterm Abortions TAB SAB Ect Mult Living                 Review of Systems + sore throat No cough No pleuritic pain No wheezing + nasal congestion + post-nasal drainage + sinus pain/pressure No itchy/red eyes No earache No hemoptysis No SOB No fever, + chills No nausea No vomiting No abdominal pain No diarrhea No urinary symptoms No skin rash + fatigue + myalgias No headache Used OTC meds without relief  Allergies  Celebrex and Vioxx  Home Medications   Prior to Admission medications   Medication Sig Start Date End Date Taking? Authorizing Provider  levothyroxine (SYNTHROID, LEVOTHROID) 75 MCG tablet Take 75 mcg by mouth daily before breakfast.   Yes Historical Provider, MD  liothyronine (CYTOMEL) 25 MCG tablet Take 25 mcg by mouth daily.   Yes Historical Provider, MD  azithromycin  (ZITHROMAX Z-PAK) 250 MG tablet Take 2 tabs today; then begin one tab once daily for 4 more days. 03/02/14   Lattie HawStephen A Holman Bonsignore, MD  fluticasone Aleda Grana(FLONASE) 50 MCG/ACT nasal spray 1 or 2 sprays each nostril twice a day 03/09/12 03/09/13  Lajean Manesavid Massey, MD   BP 129/77  Pulse 82  Temp(Src) 98.6 F (37 C) (Oral)  Ht 5\' 7"  (1.702 m)  Wt 180 lb (81.647 kg)  BMI 28.19 kg/m2  SpO2 98% Physical Exam Nursing notes and Vital Signs reviewed. Appearance:  Patient appears healthy, stated age, and in no acute distress Eyes:  Pupils are equal, round, and reactive to light and accomodation.  Extraocular movement is intact.  Conjunctivae are not inflamed  Ears:  Canals normal.  Tympanic membranes normal.  Nose:  Mildly congested turbinates.   Maxillary sinus tenderness is present.  Pharynx:  Normal Neck:  Supple.  Tender enlarged posterior nodes are palpated bilaterally  Lungs:  Clear to auscultation.  Breath sounds are equal.  Heart:  Regular rate and rhythm without murmurs, rubs, or gallops.  Abdomen:  Nontender without masses or hepatosplenomegaly.  Bowel sounds are present.  No CVA or flank tenderness.  Extremities:  No edema.  No calf tenderness  Skin:  No rash present.   ED Course  Procedures  none     MDM   1. Viral URI; ?sinusitis    Begin Z-pack Take plain Mucinex (1200 mg guaifenesin) twice daily for cough and congestion.  May add Sudafed for sinus congestion.   Increase fluid intake, rest. May use Afrin nasal spray (or generic oxymetazoline) twice daily for about 5 days.  Also recommend using saline nasal spray several times daily and saline nasal irrigation (AYR is a common brand) Try warm salt water gargles for sore throat.  Stop all antihistamines for now, and other non-prescription cough/cold preparations. May take Delsym Cough Suppressant at bedtime for nighttime cough.  Follow-up with family doctor if not improving 7 to 10 days.     Lattie Haw, MD 03/02/14 1344

## 2014-03-02 NOTE — Discharge Instructions (Signed)
Take plain Mucinex (1200 mg guaifenesin) twice daily for cough and congestion.  May add Sudafed for sinus congestion.   Increase fluid intake, rest. °May use Afrin nasal spray (or generic oxymetazoline) twice daily for about 5 days.  Also recommend using saline nasal spray several times daily and saline nasal irrigation (AYR is a common brand) °Try warm salt water gargles for sore throat.  °Stop all antihistamines for now, and other non-prescription cough/cold preparations. °May take Delsym Cough Suppressant at bedtime for nighttime cough.  °Follow-up with family doctor if not improving 7 to 10 days.  °

## 2014-03-02 NOTE — ED Notes (Signed)
Paula [name] complains of pressure in face, fevers, sore throat, runny nose with yellow drainage and hoarseness for 1 week.

## 2014-03-07 ENCOUNTER — Ambulatory Visit: Payer: BC Managed Care – PPO | Admitting: Podiatry

## 2014-04-17 ENCOUNTER — Encounter: Payer: Self-pay | Admitting: Sports Medicine

## 2014-04-17 ENCOUNTER — Ambulatory Visit (INDEPENDENT_AMBULATORY_CARE_PROVIDER_SITE_OTHER): Payer: BC Managed Care – PPO | Admitting: Sports Medicine

## 2014-04-17 VITALS — BP 133/67 | Ht 67.0 in | Wt 175.0 lb

## 2014-04-17 DIAGNOSIS — R269 Unspecified abnormalities of gait and mobility: Secondary | ICD-10-CM

## 2014-04-17 DIAGNOSIS — M25579 Pain in unspecified ankle and joints of unspecified foot: Secondary | ICD-10-CM | POA: Diagnosis not present

## 2014-04-17 NOTE — Assessment & Plan Note (Signed)
The most effective treatment for her has been custom orthotics  Patient was fitted for a : standard, cushioned, semi-rigid orthotic. The orthotic was heated and afterward the patient stood on the orthotic blank positioned on the orthotic stand. The patient was positioned in subtalar neutral position and 10 degrees of ankle dorsiflexion in a weight bearing stance. After completion of molding, a stable base was applied to the orthotic blank. The blank was ground to a stable position for weight bearing. Size: 9 red EVA Base: blue med EVA Posting: RT MT pad Additional orthotic padding: Left heel post  Preparation time was 40 minutes   Major f. She also has dress orthotic that she should use in shoes for events/    These were fitted for exercise and to the patient's walking shoes. She should use these for exercise and regular walking  We will replace these as needed

## 2014-04-17 NOTE — Progress Notes (Signed)
Patient ID: Paula [name], female   DOB: 01/06/1961, 53 y.o.   MRN: [id number]  This patient with bilateral chronic foot pain She had significant plantar fasciitis about 20 years ago She has been in custom orthotics for the past 20 years and has allowed her to keep on a very active walking program She walks about 3 miles a day  Her older orthotics are working very well but then her podiatrist retired And the tear was made but was very uncomfortable so she came here to see if we could make a different type  She tried going without orthotics but within 2 days is significant bilateral arch pain return  Physical examination No acute distress, pleasant white female BP 133/67  Ht 5\' 7"  (1.702 m)  Wt 175 lb (79.379 kg)  BMI 27.40 kg/m2  Bilaterally she has loss of the longitudinal arch She has hypertrophy of the first MTP bilaterally Bunionette her noted on both feet She has a splayed toes between 3 and 4 on the right with separation of the medial from the lateral column There is a drop of the metatarsal heads particularly 3 and 4 on the plantar surface right On the left foot she has no loss of the transverse arch Walking is pronated without correction

## 2014-04-17 NOTE — Assessment & Plan Note (Signed)
After completion of the custom orthotics the patient had good control pronation  She had good comfort with walking and was okay with a small metatarsal pad on the right

## 2014-05-16 ENCOUNTER — Encounter: Payer: BC Managed Care – PPO | Admitting: Sports Medicine

## 2014-06-24 ENCOUNTER — Emergency Department (INDEPENDENT_AMBULATORY_CARE_PROVIDER_SITE_OTHER)
Admission: EM | Admit: 2014-06-24 | Discharge: 2014-06-24 | Disposition: A | Discharge status: Home / Self Care | Payer: BC Managed Care – PPO | Source: Home / Self Care

## 2014-06-24 ENCOUNTER — Encounter: Payer: Self-pay | Admitting: Emergency Medicine

## 2014-06-24 DIAGNOSIS — Z23 Encounter for immunization: Secondary | ICD-10-CM

## 2014-06-24 MED ORDER — INFLUENZA VAC SPLIT QUAD 0.5 ML IM SUSY
0.5000 mL | PREFILLED_SYRINGE | INTRAMUSCULAR | Status: AC
  Administered 2014-06-24: 0.5 mL via INTRAMUSCULAR

## 2014-06-24 NOTE — ED Notes (Signed)
Desires flu vaccination. 

## 2015-10-31 ENCOUNTER — Other Ambulatory Visit: Payer: Self-pay | Admitting: Rheumatology

## 2015-10-31 DIAGNOSIS — R1310 Dysphagia, unspecified: Secondary | ICD-10-CM

## 2015-11-06 ENCOUNTER — Ambulatory Visit
Admission: RE | Admit: 2015-11-06 | Discharge: 2015-11-06 | Disposition: A | Discharge status: Home / Self Care | Payer: Managed Care, Other (non HMO) | Source: Ambulatory Visit | Attending: Rheumatology | Admitting: Rheumatology

## 2015-11-06 DIAGNOSIS — R1310 Dysphagia, unspecified: Secondary | ICD-10-CM

## 2016-02-04 ENCOUNTER — Encounter: Payer: Self-pay | Admitting: Emergency Medicine

## 2016-02-04 ENCOUNTER — Emergency Department (INDEPENDENT_AMBULATORY_CARE_PROVIDER_SITE_OTHER)
Admission: EM | Admit: 2016-02-04 | Discharge: 2016-02-04 | Disposition: A | Discharge status: Home / Self Care | Payer: Managed Care, Other (non HMO) | Source: Home / Self Care | Attending: Family Medicine | Admitting: Family Medicine

## 2016-02-04 DIAGNOSIS — Z8719 Personal history of other diseases of the digestive system: Secondary | ICD-10-CM | POA: Diagnosis not present

## 2016-02-04 DIAGNOSIS — R110 Nausea: Secondary | ICD-10-CM | POA: Diagnosis not present

## 2016-02-04 DIAGNOSIS — R1032 Left lower quadrant pain: Secondary | ICD-10-CM | POA: Diagnosis not present

## 2016-02-04 MED ORDER — AMOXICILLIN-POT CLAVULANATE 875-125 MG PO TABS: 1.0000 | ORAL_TABLET | Freq: Two times a day (BID) | ORAL | Status: DC

## 2016-02-04 NOTE — ED Provider Notes (Signed)
CSN: [id number]     Arrival date & time 02/04/16  0900 History   First MD Initiated Contact with Patient 02/04/16 0914     Chief Complaint  Patient presents with  . Abdominal Pain   (Consider location/radiation/quality/duration/timing/severity/associated sxs/prior Treatment) HPI Paula [name] is a 56 y.o. female presenting to UC with c/o 1 day of LLQ abdominal pain with associated mild nausea. Pain is cramping, 6/10, worse with certain movements and in certain positions.  She reports drinking a lot of fruit and vegetable shakes recently and is concerned the seeds are what caused her current pain. She reports hx of diverticulitis about 2 years ago, found to have "a lot of pouches" during a colonoscopy after first episode of diverticulitis.  She took cipro and flagyl for that episode, symptoms resolved w/o complication.  She has sense been dx with Lyme's disease and has been on flagyl intermittently. She would like to avoid taking flagyl if possible but would like to be treated for likely diverticulitis.  Denies fever, vomiting or diarrhea. She as f/u with GI next week for acid reflux but will also mention her LLQ abdominal pain.  Abdominal surgical hx of oophorectomy.  Denies urinary or vaginal symptoms.    Past Medical History  Diagnosis Date  . Thyroid disease   . Diverticulosis   . Vitamin B deficiency   . Anxiety   . Gastric ulcer   . Hypercholesterolemia   . Lyme disease   . Arthritis    Past Surgical History  Procedure Laterality Date  . Oophorectomy  2004  . Knee surgery  2007  . Shoulder surgery  03/2012   Family History  Problem Relation Age of Onset  . Colon cancer Neg Hx   . Pancreatic cancer Neg Hx   . Rectal cancer Neg Hx   . Stomach cancer Neg Hx    Social History  Substance Use Topics  . Smoking status: Never Smoker   . Smokeless tobacco: Never Used  . Alcohol Use: Yes     Comment: once weekly   OB History    No data available     Review of Systems   Constitutional: Negative for fever and chills.  Gastrointestinal: Positive for nausea and abdominal pain. Negative for vomiting, diarrhea and constipation.  Genitourinary: Negative for dysuria, urgency, frequency, hematuria and flank pain.  Musculoskeletal: Negative for myalgias and back pain.    Allergies  Celebrex and Vioxx  Home Medications   Prior to Admission medications   Medication Sig Start Date End Date Taking? Authorizing Provider  amoxicillin (AMOXIL) 250 MG capsule  04/04/14   Historical Provider, MD  amoxicillin-clavulanate (AUGMENTIN) 875-125 MG tablet Take 1 tablet by mouth 2 (two) times daily. One po bid x 7 days 02/04/16   Junius Finner, PA-C  azithromycin (ZITHROMAX Z-PAK) 250 MG tablet Take 2 tabs today; then begin one tab once daily for 4 more days. 03/02/14   Lattie Haw, MD  chlorhexidine (PERIDEX) 0.12 % solution  04/02/14   Historical Provider, MD  fluticasone Aleda Grana) 50 MCG/ACT nasal spray 1 or 2 sprays each nostril twice a day 03/09/12 03/09/13  Lajean Manes, MD  levothyroxine (SYNTHROID, LEVOTHROID) 75 MCG tablet Take 75 mcg by mouth daily before breakfast.    Historical Provider, MD  liothyronine (CYTOMEL) 25 MCG tablet Take 25 mcg by mouth daily.    Historical Provider, MD  LORazepam (ATIVAN) 2 MG tablet  04/04/14   Historical Provider, MD  methylPREDNIsolone (MEDROL DOSPACK) 4 MG tablet  04/15/14   Historical Provider, MD  MOVIPREP 100 G SOLR  02/01/14   Historical Provider, MD  predniSONE (DELTASONE) 10 MG tablet  03/09/14   Historical Provider, MD  traMADol Janean Sark(ULTRAM) 50 MG tablet  04/04/14   Historical Provider, MD   Meds Ordered and Administered this Visit  Medications - No data to display  BP 125/79 mmHg  Pulse 68  Temp(Src) 98 F (36.7 C) (Oral)  Ht 5\' 7"  (1.702 m)  Wt 200 lb (90.719 kg)  BMI 31.32 kg/m2  SpO2 97% No data found.   Physical Exam  Constitutional: She appears well-developed and well-nourished. No distress.  HENT:  Head:  Normocephalic and atraumatic.  Mouth/Throat: Oropharynx is clear and moist.  Eyes: Conjunctivae are normal. No scleral icterus.  Neck: Normal range of motion. Neck supple.  Cardiovascular: Normal rate, regular rhythm and normal heart sounds.   Pulmonary/Chest: Effort normal and breath sounds normal. No respiratory distress. She has no wheezes. She has no rales.  Abdominal: Soft. Bowel sounds are normal. She exhibits no distension and no mass. There is tenderness. There is no rebound and no guarding.  Soft abdomen, diffuse tenderness, worse in LLQ w/o rebound, guarding or masses. No CVA tenderness.  Musculoskeletal: Normal range of motion.  Neurological: She is alert.  Skin: Skin is warm and dry. She is not diaphoretic.  Nursing note and vitals reviewed.   ED Course  Procedures (including critical care time)  Labs Review Labs Reviewed - No data to display  Imaging Review No results found.   MDM   1. LLQ abdominal pain   2. Nausea without vomiting   3. History of diverticulitis    Pt c/o 1 day of LLQ, contributed to drinking fruit and vegetable shakes with seeds. Hx of diverticulitis.  Discussed labs and imaging vs treating empirically due to hx of similar pain secondary to diverticulitis. Pt would like to try trial of antibiotics but requested to stay away from flagyl due to side effects.  Rx: Augmentin Encouraged f/u with PCP in 3-4 days if not improving, sooner if worsening. F/u with GI specialist next week as planned, be sure to mention current LLQ pain, may need repeat colonoscopy. Discussed symptoms that warrant emergent care in the ED. Patient verbalized understanding and agreement with treatment plan.      Junius FinnerErin O'Malley, PA-C 02/04/16 [phone]

## 2016-02-04 NOTE — Discharge Instructions (Signed)
Please be sure to follow up with your GI specialist next week as planned and discuss today's left lower abdominal pain, believed to be a recurrent episode of diverticulitis as you may need a repeat colonoscopy.

## 2016-02-04 NOTE — ED Notes (Signed)
LLQ pain last night hx diverticulitis. 6/10

## 2016-02-10 ENCOUNTER — Encounter: Payer: Self-pay | Admitting: Gastroenterology

## 2016-02-10 ENCOUNTER — Ambulatory Visit (INDEPENDENT_AMBULATORY_CARE_PROVIDER_SITE_OTHER): Payer: Managed Care, Other (non HMO) | Admitting: Gastroenterology

## 2016-02-10 ENCOUNTER — Telehealth: Payer: Self-pay | Admitting: Gastroenterology

## 2016-02-10 VITALS — BP 110/70 | HR 60 | Ht 67.0 in | Wt 199.2 lb

## 2016-02-10 DIAGNOSIS — R1310 Dysphagia, unspecified: Secondary | ICD-10-CM

## 2016-02-10 DIAGNOSIS — K219 Gastro-esophageal reflux disease without esophagitis: Secondary | ICD-10-CM | POA: Diagnosis not present

## 2016-02-10 DIAGNOSIS — R1032 Left lower quadrant pain: Secondary | ICD-10-CM

## 2016-02-10 MED ORDER — AMOXICILLIN-POT CLAVULANATE 875-125 MG PO TABS
1.0000 | ORAL_TABLET | Freq: Two times a day (BID) | ORAL | 0 refills | Status: AC
Start: 2016-02-10 — End: 2016-02-13

## 2016-02-10 NOTE — Telephone Encounter (Signed)
Informed Paula [name] that the old sig was still on the rx and patient is only taking it for 3 more days. He verbalized understanding and will remove the 7 days part.

## 2016-02-10 NOTE — Progress Notes (Signed)
    History of Present Illness: This is a 55 year old female with GERD, dysphagia for several months and recent left lower quadrant pain. She was evaluated at [facility] urgent care center for acute left lower quadrant abdominal pain on 7/19. She was treated for presumed diverticulitis with Augmentin and she symptoms are improving now on day 7 of probiotics. No blood work or imaging was performed. She relates several months of intermittent reflux symptoms and intermittent solid food dysphagia. She is undergoing evaluation for possible scleroderma. Barium esophagram as below. When she remains on omeprazole daily she has no difficulty swallowing or reflux symptoms. She states she underwent EGD in [place] about 10 years ago with findings of a gastric ulcer. Colonoscopy listed below. Denies weight loss, constipation, diarrhea, change in stool caliber, melena, hematochezia, nausea, vomiting, chest pain.   Colonoscopy 01/2014 ENDOSCOPIC IMPRESSION: 1. Mild diverticulosis in the sigmoid colon 2. The colon was otherwise normal  Barium esophagram 11/06/2015 IMPRESSION: 1. Mild gastroesophageal reflux. Barium pill passes into the stomach without delay. 2. Mild tertiary contractions in the distal esophagus appear   Current Medications, Allergies, Past Medical History, Past Surgical History, Family History and Social History were reviewed in [employer] record.  Physical Exam: General: Well developed, well nourished, no acute distress Head: Normocephalic and atraumatic Eyes:  sclerae anicteric, EOMI Ears: Normal auditory acuity Mouth: No deformity or lesions Lungs: Clear throughout to auscultation Heart: Regular rate and rhythm; no murmurs, rubs or bruits Abdomen: Soft, mild LLQ > RLQ tenderness and non distended. No masses, hepatosplenomegaly or hernias noted. Normal Bowel sounds Musculoskeletal: Symmetrical with no gross deformities  Pulses:  Normal pulses  noted Extremities: No clubbing, cyanosis, edema or deformities noted Neurological: Alert oriented x 4, grossly nonfocal Psychological:  Alert and cooperative. Normal mood and affect  Assessment and Recommendations:  1. GERD and dysphagia. Symptoms are all likely related to GERD. Cannot exclude esophagitis and esophageal motility disorders. Early scleroderma impacting the esophagus is possible. Mild tertiary contractions and mild reflux noted on barium esophagram. Continue standard antireflux measures and omeprazole 20 mg daily. Schedule EGD. The risks (including bleeding, perforation, infection, missed lesions, medication reactions and possible hospitalization or surgery if complications occur), benefits, and alternatives to endoscopy with possible biopsy and possible dilation were discussed with the patient and they consent to proceed.   2. Presumed diverticulitis. Continue Augmentin for 3 more days for a total of 10 days antibiotics.

## 2016-02-10 NOTE — Patient Instructions (Signed)
We have sent the following medications to your pharmacy for you to pick up at your convenience:Augmentin.  Patient advised to avoid spicy, acidic, citrus, chocolate, mints, fruit and fruit juices.  Limit the intake of caffeine, alcohol and Soda.  Don't exercise too soon after eating.  Don't lie down within 3-4 hours of eating.  Elevate the head of your bed.  You have been scheduled for an endoscopy. Please follow written instructions given to you at your visit today. If you use inhalers (even only as needed), please bring them with you on the day of your procedure. Your physician has requested that you go to www.startemmi.com and enter the access code given to you at your visit today. This web site gives a general overview about your procedure. However, you should still follow specific instructions given to you by our office regarding your preparation for the procedure.  Thank you for choosing me and [facility].  Venita Lick. Pleas Koch., MD., Clement[facility] Graham

## 2016-03-13 IMAGING — NM NM PARATHYROID W/ SPECT
4 series · 24 of 24 positions shown · non-contrast
Comparison: None.

CLINICAL DATA: Elevated PTH

EXAM:
NM PARATHYROID SCINTIGRAPHY AND SPECT IMAGING
TECHNIQUE: Following intravenous administration of radiopharmaceutical, early
and 2-hour delayed planar images were obtained in the anterior
projection. Delayed triplanar SPECT images were also obtained at 2
hours.
RADIOPHARMACEUTICALS:  Twenty-five mZiWc-FFm Sestamibi IV

[Series 1: spect parathyroid · 8.28mm/px · 6 of 64 frames shown]
[frame 6/64]
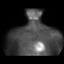
[frame 16/64]
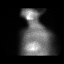
[frame 27/64]
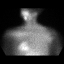
[frame 38/64]
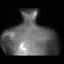
[frame 48/64]
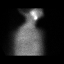
[frame 59/64]
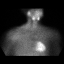

[Series 1: spect - (id)_(id)_tra · 8.3mm · 8.28mm/px · 6 of 64 frames shown]
[frame 6/64]
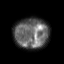
[frame 16/64]
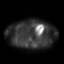
[frame 27/64]
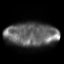
[frame 38/64]
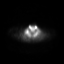
[frame 48/64]
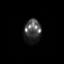
[frame 59/64]
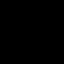

[Series 1: spect - (id)_(id)_cor · 8.3mm · 8.28mm/px · 6 of 64 frames shown]
[frame 6/64]
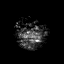
[frame 16/64]
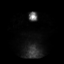
[frame 27/64]
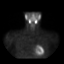
[frame 38/64]
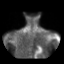
[frame 48/64]
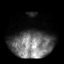
[frame 59/64]
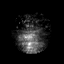

[Series 1: spect - (id)_(id)_sa · 8.3mm · 8.28mm/px · 6 of 64 frames shown]
[frame 6/64]
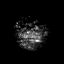
[frame 16/64]
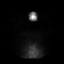
[frame 27/64]
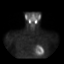
[frame 38/64]
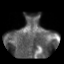
[frame 48/64]
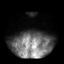
[frame 59/64]
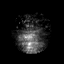

[24 of 24 positions shown; findings below may reference images not displayed]

FINDINGS: Minimal uptake within the thyroid gland on the immediate planar
imaging. 2 hr delayed planar imaging demonstrates no focal activity
in the thyroid bed. SPECT imaging of the head neck fails to
confidently localize a parathyroid adenoma. No abnormal activity in
the chest.
IMPRESSION: Planar imaging and SPECT imaging of the head and neck fails to
localize a parathyroid adenoma.

## 2016-03-17 ENCOUNTER — Encounter: Payer: Self-pay | Admitting: Gastroenterology

## 2016-03-21 IMAGING — US US SOFT TISSUE HEAD/NECK
1 series · 14 of 25 positions shown · non-contrast
Comparison: Nuclear medicine study on 01/08/2014

CLINICAL DATA: Hyperparathyroidism with negative nuclear medicine
parathyroid scan.

EXAM:
THYROID ULTRASOUND
TECHNIQUE: Ultrasound examination of the thyroid gland and adjacent soft
tissues was performed.

[Series 1: us soft tissue head/neck · 0.07mm/px · 14 of 45 slices shown]
[im 1/45]
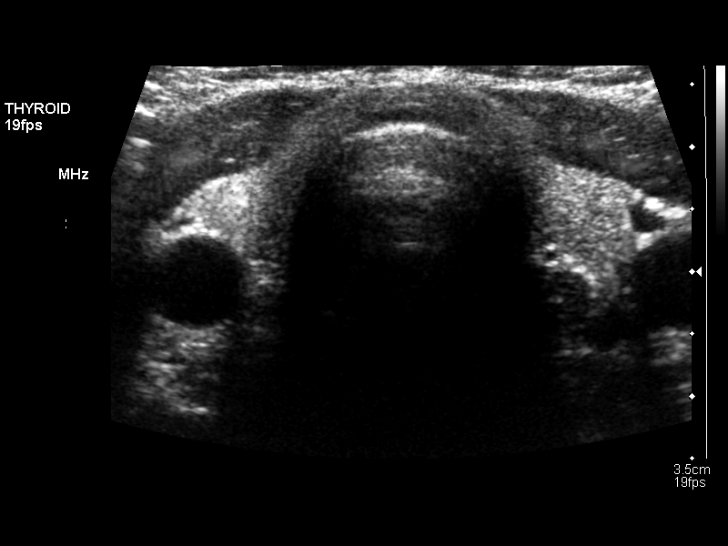
[im 4/45]
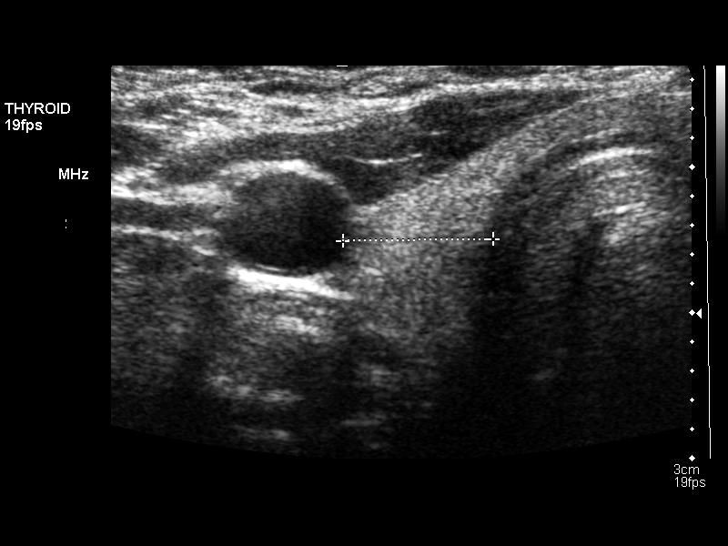
[im 8/45]
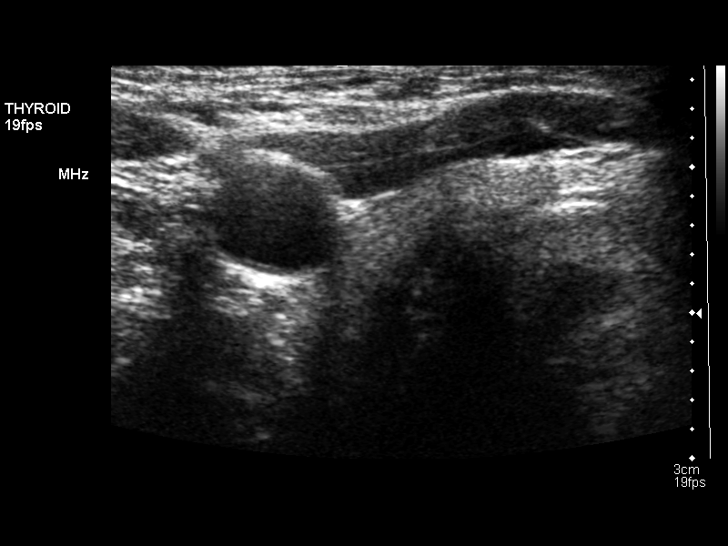
[im 12/45]
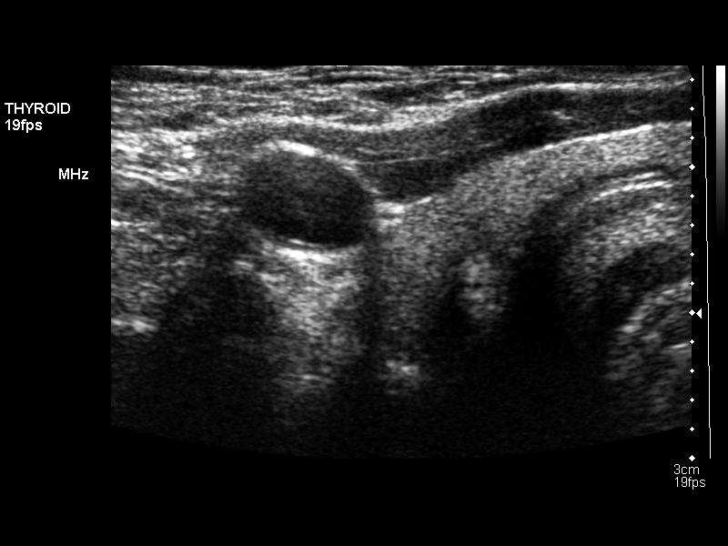
[im 15/45]
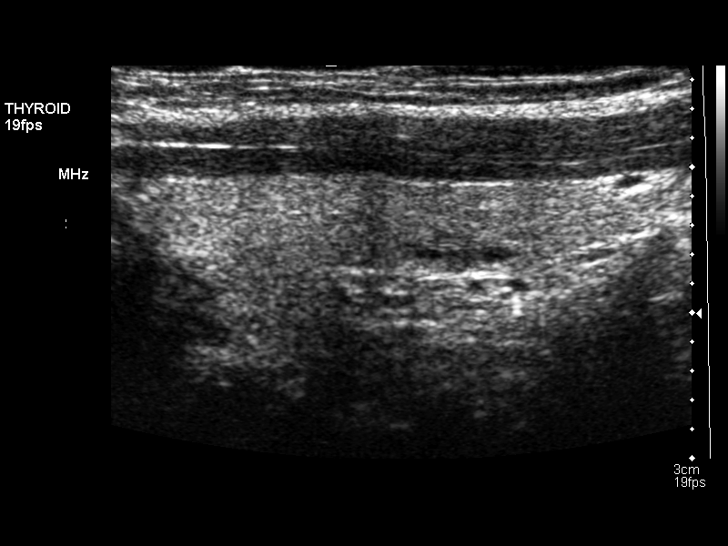
[im 17/45]
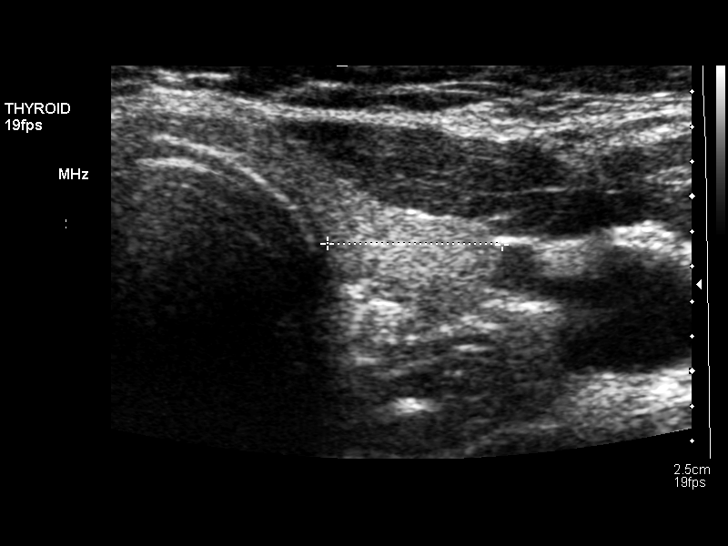
[im 21/45]
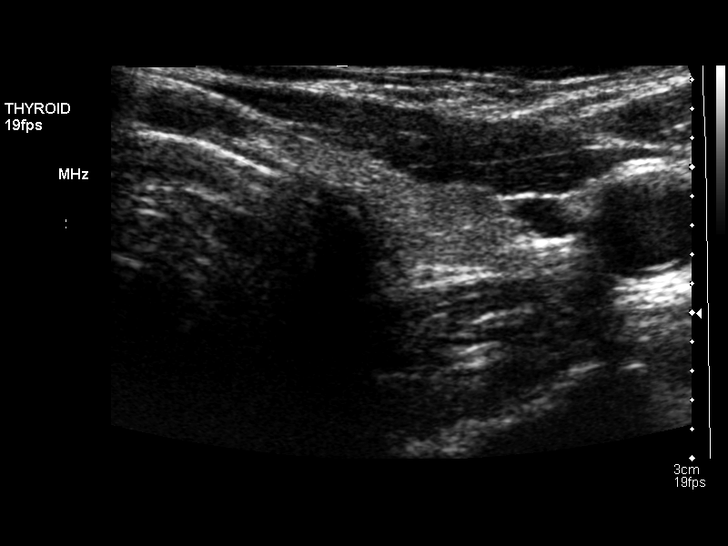
[im 24/45]
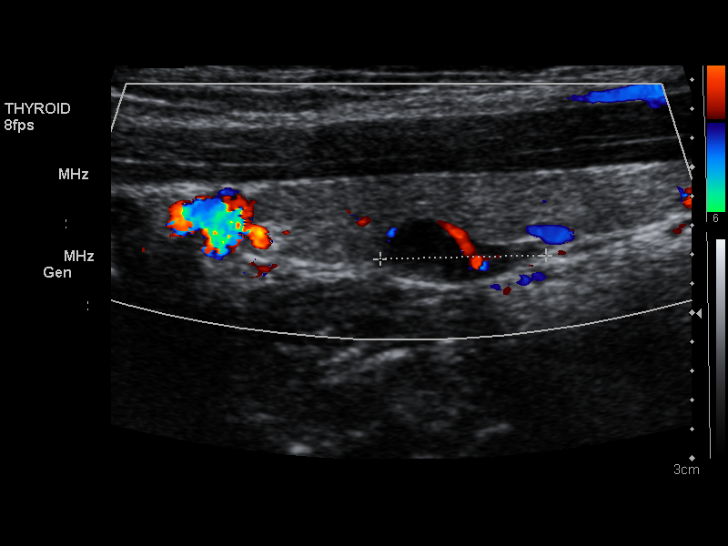
[im 28/45]
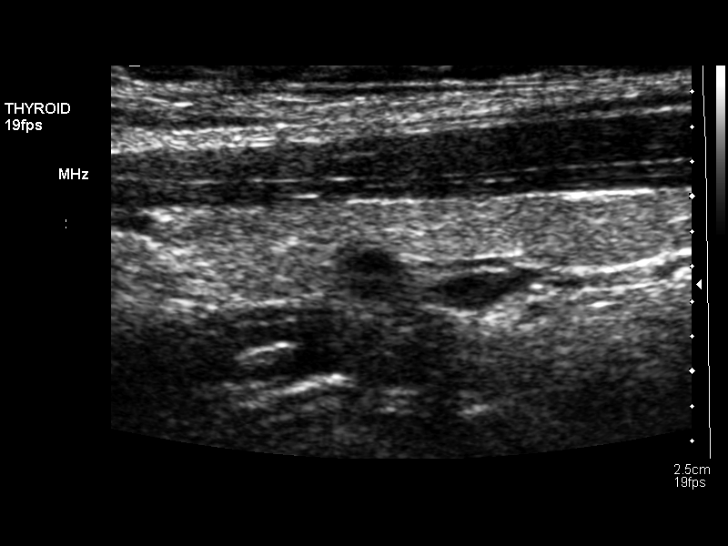
[im 30/45]
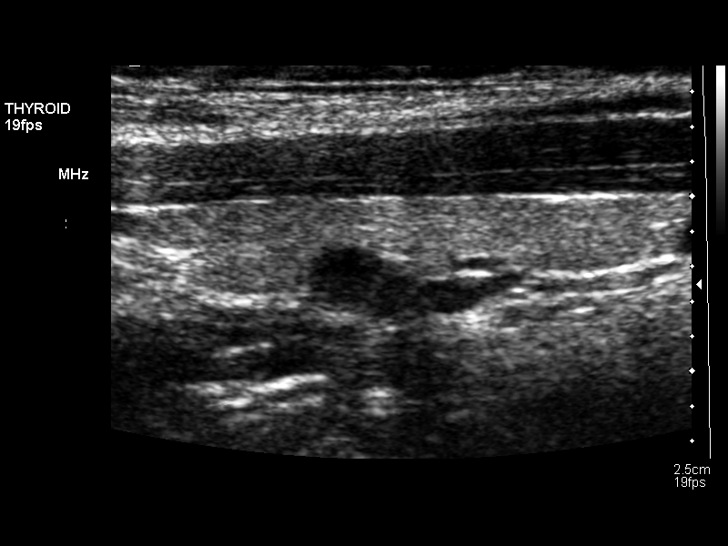
[im 34/45]
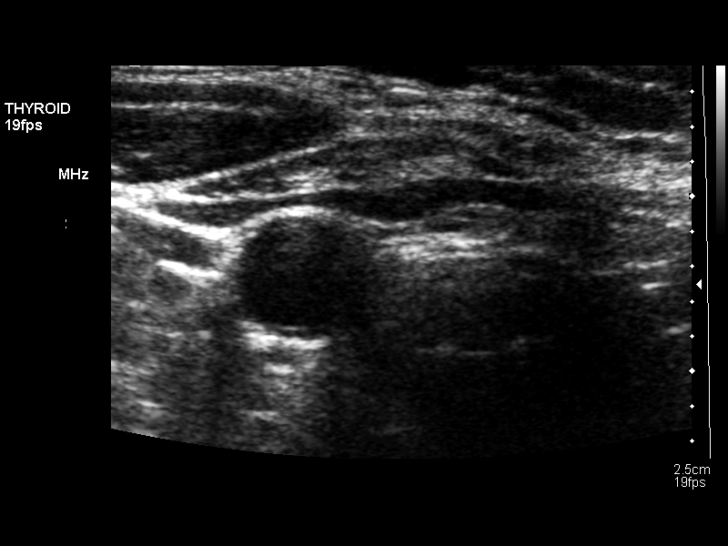
[im 37/45]
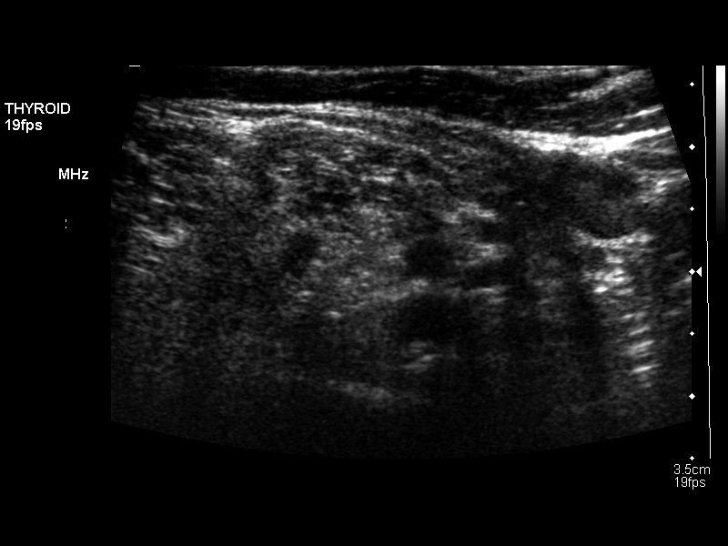
[im 41/45]
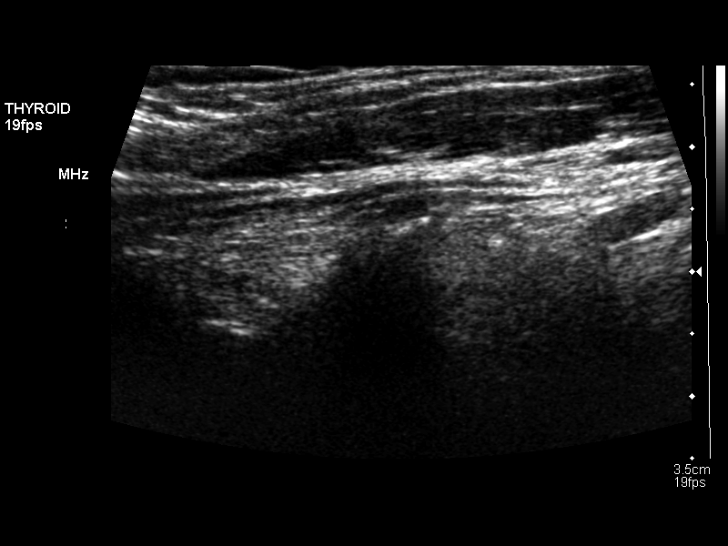
[im 45/45]
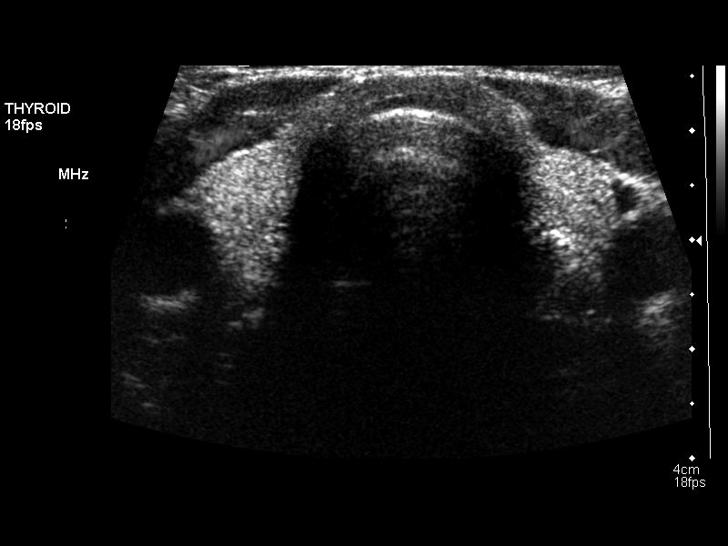

[14 of 25 positions shown; findings below may reference images not displayed]

FINDINGS: Right thyroid lobe

Measurements: 4.1 x 0.8 x 1.0 cm.  No nodules visualized.

Left thyroid lobe

Measurements: 3.5 x 0.8 x 1.0 Cm. Mildly complex cyst of the
posterior mid left lobe measures approximately 0.6 cm.

Isthmus

Thickness: 0.2 cm.  No nodules visualized.

Lymphadenopathy

None visualized.
IMPRESSION: Unremarkable thyroid ultrasound with 0.6 cm complex left thyroid
cyst.

## 2016-03-31 ENCOUNTER — Ambulatory Visit (AMBULATORY_SURGERY_CENTER): Payer: Managed Care, Other (non HMO) | Admitting: Gastroenterology

## 2016-03-31 ENCOUNTER — Encounter: Payer: Self-pay | Admitting: Gastroenterology

## 2016-03-31 VITALS — BP 122/72 | HR 90 | Temp 98.9°F | Resp 26 | Ht 67.0 in | Wt 199.0 lb

## 2016-03-31 DIAGNOSIS — R1310 Dysphagia, unspecified: Secondary | ICD-10-CM | POA: Diagnosis not present

## 2016-03-31 DIAGNOSIS — K219 Gastro-esophageal reflux disease without esophagitis: Secondary | ICD-10-CM

## 2016-03-31 DIAGNOSIS — K2950 Unspecified chronic gastritis without bleeding: Secondary | ICD-10-CM | POA: Diagnosis not present

## 2016-03-31 MED ORDER — SODIUM CHLORIDE 0.9 % IV SOLN: 500.0000 mL | INTRAVENOUS | Status: DC

## 2016-03-31 NOTE — Progress Notes (Signed)
Called to room to assist during endoscopic procedure.  Patient ID and intended procedure confirmed with present staff. Received instructions for my participation in the procedure from the performing physician.  

## 2016-03-31 NOTE — Patient Instructions (Addendum)
YOU HAD AN ENDOSCOPIC PROCEDURE TODAY AT THE [facility]:   Refer to the procedure report that was given to you for any specific questions about what was found during the examination.  If the procedure report does not answer your questions, please call your gastroenterologist to clarify.  If you requested that your care partner not be given the details of your procedure findings, then the procedure report has been included in a sealed envelope for you to review at your convenience later.  YOU SHOULD EXPECT: Some feelings of bloating in the abdomen. Passage of more gas than usual.  Walking can help get rid of the air that was put into your GI tract during the procedure and reduce the bloating. If you had a lower endoscopy (such as a colonoscopy or flexible sigmoidoscopy) you may notice spotting of blood in your stool or on the toilet paper. If you underwent a bowel prep for your procedure, you may not have a normal bowel movement for a few days.  Please Note:  You might notice some irritation and congestion in your nose or some drainage.  This is from the oxygen used during your procedure.  There is no need for concern and it should clear up in a day or so.  SYMPTOMS TO REPORT IMMEDIATELY:   Following lower endoscopy (colonoscopy or flexible sigmoidoscopy):  Excessive amounts of blood in the stool  Significant tenderness or worsening of abdominal pains  Swelling of the abdomen that is new, acute  Fever of 100F or higher   For urgent or emergent issues, a gastroenterologist can be reached at any hour by calling [phone].   DIET:    10:30 UNTIL 12:30 ONLY CLEAR LIQUIDS. 12:30 UNTIL MORNING ONLY SOFT FOODS. RESUME YOUR REGULAR DIET IN AM.   ACTIVITY:  You should plan to take it easy for the rest of today and you should NOT DRIVE or use heavy machinery until tomorrow (because of the sedation medicines used during the test).  FOLLOW UP: Our staff will call the number listed on  your records the next business day following your procedure to check on you and address any questions or concerns that you may have regarding the information given to you following your procedure. If we do not reach you, we will leave a message.  However, if you are feeling well and you are not experiencing any problems, there is no need to return our call.  We will assume that you have returned to your regular daily activities without incident.  If any biopsies were taken you will be contacted by phone or by letter within the next 1-3 weeks.  Please call us at [phone] if you have not heard about the biopsies in 3 weeks.    SIGNATURES/CONFIDENTIALITY: You and/or your care partner have signed paperwork which will be entered into your electronic medical record.  These signatures attest to the fact that that the information above on your After Visit Summary has been reviewed and is understood.  Full responsibility of the confidentiality of this discharge information lies with you and/or your care-partner.

## 2016-03-31 NOTE — Progress Notes (Signed)
Report to PACU, RN, vss, BBS= Clear.  

## 2016-03-31 NOTE — Op Note (Signed)
[facility] Patient Name: Paula [name] Procedure Date: 03/31/2016 10:10 AM MRN: [id number] Endoscopist: Meryl Dare , MD Age: 55 Referring MD:  Date of Birth: 31-Jul-1960 Gender: Female Account #: [id number] Procedure:                Upper GI endoscopy Indications:              Dysphagia, Suspected gastro-esophageal reflux                            disease Medicines:                Monitored Anesthesia Care Procedure:                Pre-Anesthesia Assessment:                           - Prior to the procedure, a History and Physical                            was performed, and patient medications and                            allergies were reviewed. The patient's tolerance of                            previous anesthesia was also reviewed. The risks                            and benefits of the procedure and the sedation                            options and risks were discussed with the patient.                            All questions were answered, and informed consent                            was obtained. Prior Anticoagulants: The patient has                            taken no previous anticoagulant or antiplatelet                            agents. ASA Grade Assessment: II - A patient with                            mild systemic disease. After reviewing the risks                            and benefits, the patient was deemed in                            satisfactory condition to undergo the procedure.  After obtaining informed consent, the endoscope was                            passed under direct vision. Throughout the                            procedure, the patient's blood pressure, pulse, and                            oxygen saturations were monitored continuously. The                            Model GIF-[id number](SN#[id number]) scope was introduced                            through the mouth, and advanced to the second  part                            of duodenum. The upper GI endoscopy was                            accomplished without difficulty. The patient                            tolerated the procedure well. Scope In: Scope Out: Findings:                 The esophagus was normal. A guidewire was placed                            and the scope was withdrawn. Dilation was performed                            with a Savary dilator with no resistance at 16 mm.                            No heme on dilator.                           The cardia and gastric fundus were normal on                            retroflexion.                           Localized mild inflammation characterized by                            erythema was found in the gastric antrum. Biopsies                            were taken with a cold forceps for histology. The                            stomach otherwise appeared  normal.                           The duodenal bulb and second portion of the                            duodenum were normal. Complications:            No immediate complications. Estimated Blood Loss:     Estimated blood loss: none. Impression:               - Normal esophagus. Dilated.                           - Gastritis. Biopsied.                           - Normal duodenal bulb and second portion of the                            duodenum. Recommendation:           - Patient has a contact number available for                            emergencies. The signs and symptoms of potential                            delayed complications were discussed with the                            patient. Return to normal activities tomorrow.                            Written discharge instructions were provided to the                            patient.                           - Clear liquids for 2 hours, then soft food for 1                            day, resume previous diet tomorrow.                            - Continue present medications.                           - Patient has a contact number available for                            emergencies. The signs and symptoms of potential                            delayed complications were discussed with the  patient. Return to normal activities tomorrow.                            Written discharge instructions were provided to the                            patient.                           - Await pathology results. Meryl Dare, MD 03/31/2016 10:41:17 AM This report has been signed electronically.

## 2016-04-01 ENCOUNTER — Telehealth: Payer: Self-pay | Admitting: *Deleted

## 2016-04-01 NOTE — Telephone Encounter (Signed)
No answer. Name identifier. Message left to call if questions or concerns. 

## 2016-04-04 ENCOUNTER — Emergency Department (INDEPENDENT_AMBULATORY_CARE_PROVIDER_SITE_OTHER): Payer: Managed Care, Other (non HMO)

## 2016-04-04 ENCOUNTER — Emergency Department (INDEPENDENT_AMBULATORY_CARE_PROVIDER_SITE_OTHER)
Admission: EM | Admit: 2016-04-04 | Discharge: 2016-04-04 | Disposition: A | Discharge status: Home / Self Care | Payer: Managed Care, Other (non HMO) | Source: Home / Self Care

## 2016-04-04 ENCOUNTER — Encounter: Payer: Self-pay | Admitting: Emergency Medicine

## 2016-04-04 DIAGNOSIS — S51011A Laceration without foreign body of right elbow, initial encounter: Secondary | ICD-10-CM

## 2016-04-04 DIAGNOSIS — W5512XA Struck by horse, initial encounter: Secondary | ICD-10-CM | POA: Diagnosis not present

## 2016-04-04 DIAGNOSIS — Z23 Encounter for immunization: Secondary | ICD-10-CM

## 2016-04-04 DIAGNOSIS — S5001XA Contusion of right elbow, initial encounter: Secondary | ICD-10-CM | POA: Diagnosis not present

## 2016-04-04 MED ORDER — TETANUS-DIPHTH-ACELL PERTUSSIS 5-2.5-18.5 LF-MCG/0.5 IM SUSP
0.5000 mL | Freq: Once | INTRAMUSCULAR | Status: AC
  Administered 2016-04-04: 0.5 mL via INTRAMUSCULAR

## 2016-04-04 MED ORDER — DOXYCYCLINE HYCLATE 100 MG PO CAPS
100.0000 mg | ORAL_CAPSULE | Freq: Two times a day (BID) | ORAL | 0 refills | Status: DC
Start: 2016-04-04 — End: 2016-04-14

## 2016-04-04 NOTE — Discharge Instructions (Signed)
Change dressing daily.  Keep wound clean and dry.  Wear ace wrap until swelling resolves.  Apply ice pack for 20 to 30 minutes, 3 to 4 times daily  Continue until swelling decreases.  Return for any signs of infection (or follow-up with family doctor):  Increasing redness, swelling, pain, heat, drainage, etc.  May take Ibuprofen 200mg , 4 tabs every 8 hours with food.  Return in 10 days for suture removal.

## 2016-04-04 NOTE — ED Provider Notes (Signed)
[facility]    CSN: [id number] Arrival date & time: 04/04/16  1417  First Provider Contact:  First MD Initiated Contact with Patient 04/04/16 1537        History   Chief Complaint Chief Complaint  Patient presents with  . Arm Injury    HPI Memory DanceCaroline O'hare is a 55 y.o. female.   While leading a horse out of a horse trailer today, patient was kicked on her right elbow.  She has pain/swelling of her elbow, and a small laceration posteriorly.  She is not sure if her Tdap is current.   The history is provided by the patient and the spouse.  Laceration  Location: right elbow. Length:  8mm Depth:  Through dermis Quality: straight   Bleeding: controlled   Time since incident:  1 hour Laceration mechanism:  Blunt object Pain details:    Quality:  Aching   Severity:  Moderate   Timing:  Constant   Progression:  Unchanged Foreign body present:  No foreign bodies Relieved by:  None tried Worsened by:  Movement Ineffective treatments:  None tried Tetanus status:  Out of date Associated symptoms: no focal weakness and no numbness     Past Medical History:  Diagnosis Date  . Anxiety   . Arthritis   . Diverticulosis   . Gastric ulcer   . Hypercholesterolemia   . Lyme disease   . Thyroid disease   . Vitamin B deficiency     Patient Active Problem List   Diagnosis Date Noted  . Pain in joint, ankle and foot 04/17/2014  . Abnormality of gait 04/17/2014  . Elevated parathyroid hormone 12/18/2013  . Thyroid disease     Past Surgical History:  Procedure Laterality Date  . KNEE SURGERY  2007  . OOPHORECTOMY  2004  . SHOULDER SURGERY  03/2012    OB History    No data available       Home Medications    Prior to Admission medications   Medication Sig Start Date End Date Taking? Authorizing Provider  doxycycline (VIBRAMYCIN) 100 MG capsule Take 1 capsule (100 mg total) by mouth 2 (two) times daily. Take with food. 04/04/16   Lattie HawStephen A Hildagard Sobecki, MD    levothyroxine (SYNTHROID, LEVOTHROID) 75 MCG tablet Take 75 mcg by mouth daily before breakfast.    Historical Provider, MD    Family History Family History  Problem Relation Age of Onset  . Colon cancer Neg Hx   . Pancreatic cancer Neg Hx   . Rectal cancer Neg Hx   . Stomach cancer Neg Hx     Social History Social History  Substance Use Topics  . Smoking status: Never Smoker  . Smokeless tobacco: Never Used  . Alcohol use Yes     Comment: once weekly     Allergies   Celebrex [celecoxib] and Vioxx [rofecoxib]   Review of Systems Review of Systems  Neurological: Negative for focal weakness.  All other systems reviewed and are negative.    Physical Exam Triage Vital Signs ED Triage Vitals  Enc Vitals Group     BP 04/04/16 1441 147/84     Pulse Rate 04/04/16 1441 78     Resp --      Temp 04/04/16 1441 98.5 F (36.9 C)     Temp Source 04/04/16 1441 Oral     SpO2 04/04/16 1441 99 %     Weight 04/04/16 1442 195 lb 12 oz (88.8 kg)     Height  04/04/16 1442 5\' 7"  (1.702 m)     Head Circumference --      Peak Flow --      Pain Score 04/04/16 1443 8     Pain Loc --      Pain Edu? --      Excl. in GC? --    No data found.   Updated Vital Signs BP 147/84 (BP Location: Left Arm)   Pulse 78   Temp 98.5 F (36.9 C) (Oral)   Ht 5\' 7"  (1.702 m)   Wt 195 lb 12 oz (88.8 kg)   SpO2 99%   BMI 30.66 kg/m   Visual Acuity Right Eye Distance:   Left Eye Distance:   Bilateral Distance:    Right Eye Near:   Left Eye Near:    Bilateral Near:     Physical Exam  Constitutional: She appears well-developed and well-nourished. No distress.  HENT:  Head: Atraumatic.  Nose: Nose normal.  Mouth/Throat: Oropharynx is clear and moist.  Eyes: Pupils are equal, round, and reactive to light.  Neck: Normal range of motion.  Cardiovascular: Normal heart sounds.   Pulmonary/Chest: Breath sounds normal.  Abdominal: There is no tenderness.  Musculoskeletal:       Right  elbow: She exhibits decreased range of motion, swelling and laceration. She exhibits no deformity. Tenderness found.       Arms: Right elbow is swollen and tender to palpation.  Patient has difficulty with full flexion and full extension but strength is intact.  She can flex/extend/rotate her right wrist without difficulty.  Distal neurovascular function is intact. There is a superficial 8mm laceration on the posterior elbow just superior to the olecranon.  Neurological: She is alert.  Skin: Skin is warm and dry.  Nursing note and vitals reviewed.    UC Treatments / Results  Labs (all labs ordered are listed, but only abnormal results are displayed) Labs Reviewed - No data to display  EKG  EKG Interpretation None       Radiology Dg Elbow Complete Right  Result Date: 04/04/2016 CLINICAL DATA:  Kicked by a horse on the right elbow today. Posterior pain and laceration. EXAM: RIGHT ELBOW - COMPLETE 3+ VIEW COMPARISON:  None. FINDINGS: Large dorsal laceration. On one of the oblique views, there is some amorphous bony density in the irregularity along the medial humeral epicondyle, raise the possibility of avulsion injury of the common flexor tendon attachment. I do not see an elbow joint effusion. Supinator fat pad reasonably normal. I doubt that the triceps is ruptured. IMPRESSION: 1. Large dorsal laceration. 2. Abnormal appearance along the medial humeral epicondyle, with amorphous bony density in this vicinity suspicious for an avulsion of the common flexor tendon attachment. Correlate with any functional loss in flexion of the wrist, or pain in the elbow with attempts to flex the wrist. Electronically Signed   By: Gaylyn Rong M.D.   On: 04/04/2016 15:10    Procedures Procedures  Laceration Repair Discussed benefits and risks of procedure and verbal consent obtained. Using sterile technique and local anesthesia with 1% lidocaine with epinephrine, cleansed wound with Betadine  followed by copious lavage with normal saline.  Wound carefully inspected for debris and foreign bodies; none found.  Wound closed with #1, 4-0 interrupted nylon sutures.  Bacitracin and non-stick sterile dressing applied.  Wound precautions explained to patient.    Return for suture removal in 10 days.   Medications Ordered in UC Medications  Tdap (BOOSTRIX) injection 0.5 mL (  not administered)     Initial Impression / Assessment and Plan / UC Course  I have reviewed the triage vital signs and the nursing notes.  Pertinent labs & imaging results that were available during my care of the patient were reviewed by me and considered in my medical decision making (see chart for details).  Clinical Course  Applied ace wrap to right elbow. Begin empiric doxycycline 100mg  BID for staph coverage. Tdap administered Change dressing daily.  Keep wound clean and dry.  Wear ace wrap until swelling resolves.  Apply ice pack for 20 to 30 minutes, 3 to 4 times daily  Continue until swelling decreases.  Return for any signs of infection (or follow-up with family doctor):  Increasing redness, swelling, pain, heat, drainage, etc.  May take Ibuprofen 200mg , 4 tabs every 8 hours with food.  Return in 10 days for suture removal.    Because of a possibility of an avulsion injury of the common flexor tendon attachment, recommend followup with orthopedist in two days.  Note that patient does not have any functional loss of flexion of the wrist.     Final Clinical Impressions(s) / UC Diagnoses   Final diagnoses:  Contusion of right elbow, initial encounter  Laceration of right elbow, initial encounter    New Prescriptions New Prescriptions   DOXYCYCLINE (VIBRAMYCIN) 100 MG CAPSULE    Take 1 capsule (100 mg total) by mouth 2 (two) times daily. Take with food.     Lattie Haw, MD 04/14/16 [phone]

## 2016-04-04 NOTE — ED Triage Notes (Signed)
Pt was kicked by a horse today while unloading the horse from the trailer.  Pt's elbow/forearm is swollen and bleeding.

## 2016-04-08 ENCOUNTER — Telehealth: Payer: Self-pay

## 2016-04-08 NOTE — Telephone Encounter (Signed)
Left message for patient that if there are questions or problems to call UC or PCp.  If feeling fine, no need to call.

## 2016-04-09 ENCOUNTER — Encounter: Payer: Self-pay | Admitting: Gastroenterology

## 2016-04-14 ENCOUNTER — Emergency Department (INDEPENDENT_AMBULATORY_CARE_PROVIDER_SITE_OTHER)
Admission: EM | Admit: 2016-04-14 | Discharge: 2016-04-14 | Disposition: A | Discharge status: Home / Self Care | Payer: Managed Care, Other (non HMO) | Source: Home / Self Care | Attending: Emergency Medicine | Admitting: Emergency Medicine

## 2016-04-14 ENCOUNTER — Encounter: Payer: Self-pay | Admitting: *Deleted

## 2016-04-14 DIAGNOSIS — Z5189 Encounter for other specified aftercare: Secondary | ICD-10-CM

## 2016-04-14 DIAGNOSIS — Z4802 Encounter for removal of sutures: Secondary | ICD-10-CM

## 2016-04-14 NOTE — ED Triage Notes (Signed)
Paula [name] is here today for suture removal from her RT elbow.

## 2016-04-15 NOTE — ED Provider Notes (Signed)
[facility]    CSN: [id number] Arrival date & time: 04/14/16  1627     History   Chief Complaint Chief Complaint  Patient presents with  . Suture / Staple Removal    HPI Paula [name] is a 55 y.o. female.   HPI Here for suture removal of the one suture placed for laceration right elbow 04/04/2016. She feels the wound is healing well without any drainage or swelling or fever or chills.  She saw an orthopedist for follow-up who found tiny fracture of elbow, and she uses Ace or splint for now and notes healing well and she'll be following up with orthopedist. Denies numbness or weakness Past Medical History:  Diagnosis Date  . Anxiety   . Arthritis   . Diverticulosis   . Gastric ulcer   . Hypercholesterolemia   . Lyme disease   . Thyroid disease   . Vitamin B deficiency     Patient Active Problem List   Diagnosis Date Noted  . Pain in joint, ankle and foot 04/17/2014  . Abnormality of gait 04/17/2014  . Elevated parathyroid hormone 12/18/2013  . Thyroid disease     Past Surgical History:  Procedure Laterality Date  . KNEE SURGERY  2007  . OOPHORECTOMY  2004  . SHOULDER SURGERY  03/2012    OB History    No data available       Home Medications    Prior to Admission medications   Medication Sig Start Date End Date Taking? Authorizing Provider  levothyroxine (SYNTHROID, LEVOTHROID) 75 MCG tablet Take 75 mcg by mouth daily before breakfast.    Historical Provider, MD    Family History Family History  Problem Relation Age of Onset  . Colon cancer Neg Hx   . Pancreatic cancer Neg Hx   . Rectal cancer Neg Hx   . Stomach cancer Neg Hx     Social History Social History  Substance Use Topics  . Smoking status: Never Smoker  . Smokeless tobacco: Never Used  . Alcohol use Yes     Comment: once weekly     Allergies   Celebrex [celecoxib] and Vioxx [rofecoxib]   Review of Systems Review of Systems No fever or chills or nausea or  vomiting  Physical Exam Triage Vital Signs ED Triage Vitals  Enc Vitals Group     BP 04/14/16 1637 164/78     Pulse Rate 04/14/16 1637 75     Resp 04/14/16 1637 16     Temp 04/14/16 1637 98.2 F (36.8 C)     Temp Source 04/14/16 1637 Oral     SpO2 04/14/16 1637 97 %     Weight --      Height --      Head Circumference --      Peak Flow --      Pain Score 04/14/16 1639 0     Pain Loc --      Pain Edu? --      Excl. in GC? --    No data found.   Updated Vital Signs BP 164/78 (BP Location: Left Arm)   Pulse 75   Temp 98.2 F (36.8 C) (Oral)   Resp 16   SpO2 97%   Visual Acuity Right Eye Distance:   Left Eye Distance:   Bilateral Distance:    Right Eye Near:   Left Eye Near:    Bilateral Near:     Physical Exam  Constitutional: She appears well-developed and well-nourished. No  distress.   wound right elbow is well healed and well approximated. Clean and dry. No sign of infection.   UC Treatments / Results  Labs (all labs ordered are listed, but only abnormal results are displayed) Labs Reviewed - No data to display  EKG  EKG Interpretation None       Radiology No results found.  Procedures Procedures (including critical care time)  Medications Ordered in UC Medications - No data to display   Initial Impression / Assessment and Plan / UC Course  I have reviewed the triage vital signs and the nursing notes.  Pertinent labs & imaging results that were available during my care of the patient were reviewed by me and considered in my medical decision making (see chart for details).  Clinical Course    Suture removed by me without problem. Band-Aid applied another wound care discussed. Advised to follow-up with orthopedist, see discussion in history of present illness  Final Clinical Impressions(s) / UC Diagnoses   Final diagnoses:  Visit for wound check  Visit for suture removal    New Prescriptions Discharge Medication List as of  04/14/2016  5:11 PM       Lajean Manes, MD 04/15/16 0830

## 2016-08-30 ENCOUNTER — Other Ambulatory Visit: Payer: Self-pay | Admitting: Physical Medicine and Rehabilitation

## 2016-08-30 DIAGNOSIS — M47816 Spondylosis without myelopathy or radiculopathy, lumbar region: Secondary | ICD-10-CM

## 2016-08-30 DIAGNOSIS — M545 Low back pain: Secondary | ICD-10-CM

## 2016-09-02 ENCOUNTER — Ambulatory Visit
Admission: RE | Admit: 2016-09-02 | Discharge: 2016-09-02 | Disposition: A | Discharge status: Home / Self Care | Payer: Managed Care, Other (non HMO) | Source: Ambulatory Visit | Attending: Physical Medicine and Rehabilitation | Admitting: Physical Medicine and Rehabilitation

## 2016-09-02 DIAGNOSIS — M545 Low back pain: Secondary | ICD-10-CM

## 2016-09-02 DIAGNOSIS — M47816 Spondylosis without myelopathy or radiculopathy, lumbar region: Secondary | ICD-10-CM

## 2016-11-01 ENCOUNTER — Ambulatory Visit: Payer: Self-pay | Admitting: Podiatry

## 2017-02-15 ENCOUNTER — Encounter: Payer: Self-pay | Admitting: *Deleted

## 2017-02-15 ENCOUNTER — Emergency Department (INDEPENDENT_AMBULATORY_CARE_PROVIDER_SITE_OTHER)
Admission: EM | Admit: 2017-02-15 | Discharge: 2017-02-15 | Disposition: A | Discharge status: Home / Self Care | Payer: Managed Care, Other (non HMO) | Source: Home / Self Care | Attending: Family Medicine | Admitting: Family Medicine

## 2017-02-15 DIAGNOSIS — R1030 Lower abdominal pain, unspecified: Secondary | ICD-10-CM

## 2017-02-15 MED ORDER — AMOXICILLIN-POT CLAVULANATE 875-125 MG PO TABS: 1.0000 | ORAL_TABLET | Freq: Two times a day (BID) | ORAL | 0 refills | Status: DC

## 2017-02-15 NOTE — Discharge Instructions (Signed)
Begin clear liquids for about 24 to 48 hours, then may begin a BRAT diet (Bananas, Rice, Applesauce, Toast) when nausea and dizziness resolved.  Then gradually advance to a regular diet as tolerated.  If symptoms become significantly worse during the night or over the weekend, proceed to the local emergency room.

## 2017-02-15 NOTE — ED Provider Notes (Signed)
[facility]    CSN: [id number] Arrival date & time: 02/15/17  1947     History   Chief Complaint Chief Complaint  Patient presents with  . Abdominal Pain    HPI [name] is a 56 y.o. female.   Patient complains of vague left lower quadrant abdominal pain and bloating for two days.  No fevers, chills, and sweats.  No urinary symptoms.  She has a history of diverticulitis.   The history is provided by the patient.  Abdominal Pain  Pain location:  LLQ Pain quality: aching, bloating and dull   Pain radiates to:  Does not radiate Pain severity:  Mild Onset quality:  Sudden Duration:  2 days Timing:  Constant Progression:  Worsening Chronicity:  Recurrent Context: eating   Context: not recent illness, not recent travel, not sick contacts and not suspicious food intake   Relieved by:  Nothing Worsened by:  Eating Ineffective treatments:  None tried Associated symptoms: anorexia and fatigue   Associated symptoms: no belching, no chest pain, no chills, no constipation, no cough, no diarrhea, no dysuria, no fever, no hematemesis, no hematochezia, no hematuria, no melena, no nausea and no vomiting     Past Medical History:  Diagnosis Date  . Anxiety   . Arthritis   . Diverticulosis   . Gastric ulcer   . Hypercholesterolemia   . Lyme disease   . Thyroid disease   . Vitamin B deficiency     Patient Active Problem List   Diagnosis Date Noted  . Pain in joint, ankle and foot 04/17/2014  . Abnormality of gait 04/17/2014  . Elevated parathyroid hormone 12/18/2013  . Thyroid disease     Past Surgical History:  Procedure Laterality Date  . KNEE SURGERY  2007  . OOPHORECTOMY  2004  . SHOULDER SURGERY  03/2012    OB History    No data available       Home Medications    Prior to Admission medications   Medication Sig Start Date End Date Taking? Authorizing Provider  levothyroxine (SYNTHROID, LEVOTHROID) 75 MCG tablet Take 75 mcg by mouth  daily before breakfast.   Yes [provider]  liothyronine (CYTOMEL) 25 MCG tablet Take by mouth daily.   Yes [provider]  amoxicillin-clavulanate (AUGMENTIN) 875-125 MG tablet Take 1 tablet by mouth 2 (two) times daily. Take with food 02/15/17   Lattie HawBeese, Markitta Ausburn A, MD    Family History Family History  Problem Relation Age of Onset  . Colon cancer Neg Hx   . Pancreatic cancer Neg Hx   . Rectal cancer Neg Hx   . Stomach cancer Neg Hx     Social History Social History  Substance Use Topics  . Smoking status: Never Smoker  . Smokeless tobacco: Never Used  . Alcohol use Yes     Comment: once weekly     Allergies   Celebrex [celecoxib] and Vioxx [rofecoxib]   Review of Systems Review of Systems  Constitutional: Positive for fatigue. Negative for chills and fever.  Respiratory: Negative for cough.   Cardiovascular: Negative for chest pain.  Gastrointestinal: Positive for abdominal pain and anorexia. Negative for constipation, diarrhea, hematemesis, hematochezia, melena, nausea and vomiting.  Genitourinary: Negative for dysuria and hematuria.  All other systems reviewed and are negative.    Physical Exam Triage Vital Signs ED Triage Vitals  Enc Vitals Group     BP 02/15/17 2000 136/79     Pulse Rate 02/15/17 2000 81  Resp 02/15/17 2000 16     Temp 02/15/17 2000 98.6 F (37 C)     Temp Source 02/15/17 2000 Oral     SpO2 02/15/17 2000 99 %     Weight 02/15/17 2000 200 lb (90.7 kg)     Height 02/15/17 2000 5\' 7"  (1.702 m)     Head Circumference --      Peak Flow --      Pain Score 02/15/17 2001 4     Pain Loc --      Pain Edu? --      Excl. in GC? --    No data found.   Updated Vital Signs BP 136/79 (BP Location: Left Arm)   Pulse 81   Temp 98.6 F (37 C) (Oral)   Resp 16   Ht 5\' 7"  (1.702 m)   Wt 200 lb (90.7 kg)   SpO2 99%   BMI 31.32 kg/m   Visual Acuity Right Eye Distance:   Left Eye Distance:   Bilateral Distance:     Right Eye Near:   Left Eye Near:    Bilateral Near:     Physical Exam Nursing notes and Vital Signs reviewed. Appearance:  Patient appears stated age, and in no acute distress.    Eyes:  Pupils are equal, round, and reactive to light and accomodation.  Extraocular movement is intact.  Conjunctivae are not inflamed   Pharynx:  Normal; moist mucous membranes  Neck:  Supple.  No adenopathy Lungs:  Clear to auscultation.  Breath sounds are equal.  Moving air well. Heart:  Regular rate and rhythm without murmurs, rubs, or gallops.  Abdomen:   Vague mild tenderness left lower quadrant  without masses or hepatosplenomegaly.  Bowel sounds are present.  No CVA or flank tenderness.  Extremities:  No edema.  Skin:  No rash present.     UC Treatments / Results  Labs (all labs ordered are listed, but only abnormal results are displayed)   EKG  EKG Interpretation None       Radiology No results found.  Procedures Procedures (including critical care time)  Medications Ordered in UC Medications - No data to display   Initial Impression / Assessment and Plan / UC Course  I have reviewed the triage vital signs and the nursing notes.  Pertinent labs & imaging results that were available during my care of the patient were reviewed by me and considered in my medical decision making (see chart for details).    Suspect early recurrent diverticulitis.  Begin empiric Augmentin 875mg  Q12hr (#20). Begin clear liquids for about 24 to 48 hours, then may begin a BRAT diet (Bananas, Rice, Applesauce, Toast) when nausea and dizziness resolved.  Then gradually advance to a regular diet as tolerated.  If symptoms become significantly worse during the night or over the weekend, proceed to the local emergency room.  Followup with Family Doctor if not improved in about 3 to 6 days.    Final Clinical Impressions(s) / UC Diagnoses   Final diagnoses:  Lower abdominal pain    New  Prescriptions New Prescriptions   AMOXICILLIN-CLAVULANATE (AUGMENTIN) 875-125 MG TABLET    Take 1 tablet by mouth 2 (two) times daily. Take with food     Lattie HawBeese, Nabeel Gladson A, MD 02/23/17 [phone]

## 2017-02-15 NOTE — ED Triage Notes (Signed)
Pt c/o LLQ abd pain and bloating x 2 days. Denies fever. Reports a hx of diverticulitis; request rx for ABT.

## 2017-03-07 ENCOUNTER — Telehealth: Payer: Self-pay | Admitting: Gastroenterology

## 2017-03-07 NOTE — Telephone Encounter (Signed)
Patient reports 3 weeks of diarrhea and RUQ pain.  She will come in and see Mike Gip PA on Wed at 2:30

## 2017-03-09 ENCOUNTER — Ambulatory Visit (INDEPENDENT_AMBULATORY_CARE_PROVIDER_SITE_OTHER): Payer: Managed Care, Other (non HMO) | Admitting: Physician Assistant

## 2017-03-09 ENCOUNTER — Other Ambulatory Visit (INDEPENDENT_AMBULATORY_CARE_PROVIDER_SITE_OTHER): Payer: Managed Care, Other (non HMO)

## 2017-03-09 ENCOUNTER — Encounter: Payer: Self-pay | Admitting: Physician Assistant

## 2017-03-09 VITALS — BP 138/74 | HR 76 | Ht 66.34 in | Wt 193.2 lb

## 2017-03-09 DIAGNOSIS — K30 Functional dyspepsia: Secondary | ICD-10-CM | POA: Diagnosis not present

## 2017-03-09 DIAGNOSIS — R197 Diarrhea, unspecified: Secondary | ICD-10-CM | POA: Diagnosis not present

## 2017-03-09 DIAGNOSIS — R1011 Right upper quadrant pain: Secondary | ICD-10-CM

## 2017-03-09 LAB — CBC WITH DIFFERENTIAL/PLATELET
BASOS PCT: 0.6 % (ref 0.0–3.0)
Basophils Absolute: 0 10*3/uL (ref 0.0–0.1)
Eosinophils Absolute: 0.1 10*3/uL (ref 0.0–0.7)
Eosinophils Relative: 2.1 % (ref 0.0–5.0)
HEMATOCRIT: 44.8 % (ref 36.0–46.0)
HEMOGLOBIN: 14.8 g/dL (ref 12.0–15.0)
LYMPHS PCT: 15.2 % (ref 12.0–46.0)
Lymphs Abs: 1 10*3/uL (ref 0.7–4.0)
MCHC: 32.9 g/dL (ref 30.0–36.0)
MCV: 93.6 fl (ref 78.0–100.0)
MONOS PCT: 9.3 % (ref 3.0–12.0)
Monocytes Absolute: 0.6 10*3/uL (ref 0.1–1.0)
NEUTROS ABS: 4.7 10*3/uL (ref 1.4–7.7)
Neutrophils Relative %: 72.8 % (ref 43.0–77.0)
PLATELETS: 244 10*3/uL (ref 150.0–400.0)
RBC: 4.79 Mil/uL (ref 3.87–5.11)
RDW: 14.1 % (ref 11.5–15.5)
WBC: 6.4 10*3/uL (ref 4.0–10.5)

## 2017-03-09 LAB — COMPREHENSIVE METABOLIC PANEL
ALBUMIN: 4.2 g/dL (ref 3.5–5.2)
ALK PHOS: 70 U/L (ref 39–117)
ALT: 20 U/L (ref 0–35)
AST: 17 U/L (ref 0–37)
BILIRUBIN TOTAL: 0.4 mg/dL (ref 0.2–1.2)
BUN: 26 mg/dL — AB (ref 6–23)
CO2: 28 mEq/L (ref 19–32)
Calcium: 10.3 mg/dL (ref 8.4–10.5)
Chloride: 104 mEq/L (ref 96–112)
Creatinine, Ser: 1.77 mg/dL — ABNORMAL HIGH (ref 0.40–1.20)
GFR: 31.35 mL/min — ABNORMAL LOW (ref >60.00)
Glucose, Bld: 99 mg/dL (ref 70–99)
Potassium: 4.3 mEq/L (ref 3.5–5.1)
Sodium: 137 mEq/L (ref 135–145)
Total Protein: 7.1 g/dL (ref 6.0–8.3)

## 2017-03-09 LAB — LIPASE: Lipase: 74 U/L — ABNORMAL HIGH (ref 11.0–59.0)

## 2017-03-09 LAB — SEDIMENTATION RATE: SED RATE: 17 mm/h (ref 0–30)

## 2017-03-09 NOTE — Patient Instructions (Signed)
Please go to the basement level to have your labs drawn.  You have been scheduled for an abdominal ultrasound at [facility], [facility], [address].  on Friday 8-24 at 9:00 am. Please arrive at 8:45 am to your appointment for registration. Make certain not to have anything to eat or drink 6 hours prior to your appointment. Should you need to reschedule your appointment, please contact radiology at [phone]. This test typically takes about 30 minutes to perform.

## 2017-03-09 NOTE — Progress Notes (Signed)
Subjective:    Patient ID: Paula [name], female    DOB: 1961-02-21, 56 y.o.   MRN: [id number]  HPI Paula [name] is a pleasant 56 year old white female, known to Dr. Russella [name]. She has history of hypothyroidism, elevated PTH, and crest syndrome. Also with previous history of endometriosis, and diverticulosis. She comes in today with complaints of upper abdominal pain and mild diarrhea. She had had an ER visit on 02/15/2017 but says that was for different symptoms. At that time she was complaining of lower abdominal pain which per his primarily left lower quadrant. It was felt she likely had diverticulitis and was treated with a course of Augmentin. She is not sure the antibiotics made any difference. Patient now says over past 2-3 weeks she has developed mild diarrhea with loose to watery stool 2-3 times daily. There has been no blood. No fever or chills. She also has been having sharp pains in her right upper quadrant under the right breast. She says these radiated into her back at times and up into her shoulder. She is worried about her gallbladder as she is the only one in her family who has not had a cholecystectomy. She states that many years ago she had a CCK HIDA scan at another practice and was told that her gallbladder was "sluggish". She also believes she was told that she had a gallbladder polyp. Right upper quadrant pain is currently coming and going throughout the day seems to be worse postprandially and may last for half an hour or so. Again no associated nausea or vomiting no fever or chills. She's not on any NSAIDs. She has history of gastritis, does not want to be on a chronic PPI. Has had some increase in indigestion recently. Last colonoscopy July 2015 negative with the exception of mild sigmoid diverticulosis. EGD in September 2017 esophagus normal she did have some gastritis and biopsies showed a reactive gastritis no H. pylori. She was empirically dilated to 16 mm.  Review of Systems  Pertinent positive and negative review of systems were noted in the above HPI section.  All other review of systems was otherwise negative.  Outpatient Encounter Prescriptions as of 03/09/2017  Medication Sig  . levothyroxine (SYNTHROID) 88 MCG tablet Take 1 tablet by mouth daily. 6 days a week  . liothyronine (CYTOMEL) 25 MCG tablet Take 37.5 mcg by mouth daily.   . [DISCONTINUED] amoxicillin-clavulanate (AUGMENTIN) 875-125 MG tablet Take 1 tablet by mouth 2 (two) times daily. Take with food  . [DISCONTINUED] levothyroxine (SYNTHROID, LEVOTHROID) 75 MCG tablet Take 75 mcg by mouth daily before breakfast.   Facility-Administered Encounter Medications as of 03/09/2017  Medication  . 0.9 %  sodium chloride infusion   Allergies  Allergen Reactions  . Celebrex [Celecoxib] Swelling  . Vioxx [Rofecoxib]    Patient Active Problem List   Diagnosis Date Noted  . Pain in joint, ankle and foot 04/17/2014  . Abnormality of gait 04/17/2014  . Elevated parathyroid hormone 12/18/2013  . Thyroid disease    Social History   Social History  . Marital status: Married    Spouse name: N/A  . Number of children: N/A  . Years of education: N/A   Occupational History  . Not on file.   Social History Main Topics  . Smoking status: Never Smoker  . Smokeless tobacco: Never Used  . Alcohol use Yes     Comment: once weekly  . Drug use: No  . Sexual activity: Not on file   Other Topics  Concern  . Not on file   Social History Narrative   ** Merged History Encounter **        Paula [name] family history is not on file.      Objective:    Vitals:   03/09/17 1421  BP: 138/74  Pulse: 76    Physical Exam well-developed white female in no acute distress, pleasant blood pressure 138/74 pulse 76, height 5 foot 6 weight 193, BMI 30.8. HEENT; nontraumatic normocephalic EOMI PERRLA sclera anicteric, Cardiovascular; regular rate and rhythm with S1-S2 no murmur rub or gallop, Pulmonary; clear  bilaterally, Abdomen ;soft, she has mild tenderness in the right upper quadrant there is no guarding or rebound no palpable mass or hepatosplenomegaly, there is also very mild bilateral lower quadrant tenderness, bowel sounds present, Rectal; exam not done, Extremities; no clubbing cyanosis or edema skin warm and dry, Neuropsych; mood and affect appropriate       Assessment & Plan:   #76 56 year old white female with 2-3 week history of new sharp intermittent right upper quadrant pain, and mild diarrhea with 2-3 bowel movements per day. Current symptoms onset after she took a course of Augmentin for possible diverticulitis Etiology of symptoms not clear. She may have an antibiotic induced gastropathy and antibiotic induced this diagnosis with mild diarrhea. Current symptoms very mild doubt C. Difficile Rule out biliary colic  #2 history of diverticulosis #3 history of gastritis on EGD 2017-reactive, no H. Pylori #4 CREST syndrome #5 hypothyroidism #6 elevated PTH level #7 prior history of endometriosis #8 colon cancer surveillance, up-to-date with negative colonoscopy July 2015  Plan; CBC with differential, CMET sedimentation rate, CRP, lipase Schedule for upper abdominal ultrasound this week If ultrasound is unrevealing would treat with a course of PPI for 6 weeks. Also discussed possible need for CT of the abdomen and pelvis if ultrasound negative to further evaluate both upper and lower abdominal symptoms  Paula S Esterwood PA-C 03/09/2017   Cc: No ref. provider found

## 2017-03-10 ENCOUNTER — Ambulatory Visit (HOSPITAL_COMMUNITY)
Admission: RE | Admit: 2017-03-10 | Discharge: 2017-03-10 | Disposition: A | Discharge status: Home / Self Care | Payer: Managed Care, Other (non HMO) | Source: Ambulatory Visit | Attending: Physician Assistant | Admitting: Physician Assistant

## 2017-03-10 DIAGNOSIS — K824 Cholesterolosis of gallbladder: Secondary | ICD-10-CM | POA: Diagnosis not present

## 2017-03-10 DIAGNOSIS — R1011 Right upper quadrant pain: Secondary | ICD-10-CM

## 2017-03-10 DIAGNOSIS — R197 Diarrhea, unspecified: Secondary | ICD-10-CM | POA: Diagnosis present

## 2017-03-10 DIAGNOSIS — K30 Functional dyspepsia: Secondary | ICD-10-CM | POA: Diagnosis present

## 2017-03-10 NOTE — Progress Notes (Signed)
Reviewed and agree with initial management plan.  Faun Mcqueen T. Glenwood Revoir, MD FACG 

## 2017-03-10 NOTE — Progress Notes (Signed)
Reviewed and agree with intial management plan.  Vivian Okelley T. Brenner Visconti, MD FACG 

## 2017-03-11 ENCOUNTER — Other Ambulatory Visit: Payer: Self-pay

## 2017-03-11 ENCOUNTER — Telehealth: Payer: Self-pay | Admitting: Physician Assistant

## 2017-03-11 ENCOUNTER — Ambulatory Visit (HOSPITAL_COMMUNITY): Payer: Managed Care, Other (non HMO)

## 2017-03-11 DIAGNOSIS — R1011 Right upper quadrant pain: Secondary | ICD-10-CM

## 2017-03-11 NOTE — Telephone Encounter (Signed)
Left message with the scheduler asking if there are any openings earlier than 03/17/17

## 2017-03-12 ENCOUNTER — Emergency Department (INDEPENDENT_AMBULATORY_CARE_PROVIDER_SITE_OTHER)
Admission: EM | Admit: 2017-03-12 | Discharge: 2017-03-12 | Disposition: A | Discharge status: Home / Self Care | Payer: Managed Care, Other (non HMO) | Source: Home / Self Care | Attending: Family Medicine | Admitting: Family Medicine

## 2017-03-12 ENCOUNTER — Encounter: Payer: Self-pay | Admitting: Emergency Medicine

## 2017-03-12 DIAGNOSIS — R899 Unspecified abnormal finding in specimens from other organs, systems and tissues: Secondary | ICD-10-CM | POA: Diagnosis not present

## 2017-03-12 LAB — POCT URINALYSIS DIP (MANUAL ENTRY)
BILIRUBIN UA: NEGATIVE
BILIRUBIN UA: NEGATIVE mg/dL
Blood, UA: NEGATIVE
GLUCOSE UA: NEGATIVE mg/dL
LEUKOCYTES UA: NEGATIVE
NITRITE UA: NEGATIVE
Protein Ur, POC: NEGATIVE mg/dL
Spec Grav, UA: <=1.005 — AB (ref 1.010–1.025)
Urobilinogen, UA: 0.2 E.U./dL

## 2017-03-12 NOTE — ED Triage Notes (Signed)
Patient presents to [facility] with C/O pain in the right and left lower quad, times two weeks she denies problems with voiding, although she is concerned about possible protein in the urine. She request that her urine be tested for protein.

## 2017-03-12 NOTE — ED Provider Notes (Signed)
[name]    CSN: [id number] Arrival date & time: 03/12/17  1310     History   Chief Complaint Chief Complaint  Patient presents with  . Abdominal Pain    HPI Paula [name] is a 56 y.o. female.   Patient presents for repeat lab testing.  She has had a several week history of recurring right upper quadrant sharp pain, and mild diarrhea.  She was evaluated by her gastroenterologist on 03/09/17.  Lab tests performed at that time included a CMET that yielded unexpected abnormal renal function tests:  BUN 26mg /dL, Creatinine 1.77mg /dL, and low GFR 40.98JX/BJY.  She also had an elevated lipase:  74.0 U/L. She has been unable to contact her PCP about her abnormal results, and requests repeat lab testing.  She denies any urinary symptoms. She will follow-up with her gastroenterologist for her GI symptoms.   The history is provided by the patient.    Past Medical History:  Diagnosis Date  . Anxiety   . Arthritis   . CREST syndrome (HCC)   . Diverticulosis   . Gastric ulcer   . Hypercholesterolemia   . Lyme disease   . Thyroid disease   . Vitamin B deficiency     Patient Active Problem List   Diagnosis Date Noted  . Pain in joint, ankle and foot 04/17/2014  . Abnormality of gait 04/17/2014  . Elevated parathyroid hormone 12/18/2013  . Thyroid disease     Past Surgical History:  Procedure Laterality Date  . KNEE SURGERY  2007  . OOPHORECTOMY  2004  . SHOULDER SURGERY  03/2012    OB History    No data available       Home Medications    Prior to Admission medications   Medication Sig Start Date End Date Taking? Authorizing Provider  levothyroxine (SYNTHROID) 88 MCG tablet Take 1 tablet by mouth daily. 6 days a week 09/27/14   [provider]  liothyronine (CYTOMEL) 25 MCG tablet Take 37.5 mcg by mouth daily.     [provider]    Family History Family History  Problem Relation Age of Onset  . Colon cancer Neg Hx   .  Pancreatic cancer Neg Hx   . Rectal cancer Neg Hx   . Stomach cancer Neg Hx     Social History Social History  Substance Use Topics  . Smoking status: Never Smoker  . Smokeless tobacco: Never Used  . Alcohol use Yes     Comment: once weekly     Allergies   Celebrex [celecoxib] and Vioxx [rofecoxib]   Review of Systems Review of Systems  Constitutional: Negative for activity change, appetite change, chills, diaphoresis, fatigue, fever and unexpected weight change.  HENT: Negative.   Eyes: Negative.   Respiratory: Negative.   Cardiovascular: Negative.   Gastrointestinal: Positive for abdominal pain and diarrhea. Negative for abdominal distention, anal bleeding, blood in stool, constipation, nausea, rectal pain and vomiting.  Endocrine: Negative.   Genitourinary: Negative.   Musculoskeletal: Negative.   Skin: Negative.   Neurological: Negative for headaches.     Physical Exam Triage Vital Signs ED Triage Vitals  Enc Vitals Group     BP 03/12/17 1339 (!) 153/87     Pulse Rate 03/12/17 1339 75     Resp 03/12/17 1339 16     Temp 03/12/17 1339 97.9 F (36.6 C)     Temp Source 03/12/17 1339 Oral     SpO2 03/12/17 1339 98 %  Weight 03/12/17 1340 190 lb (86.2 kg)     Height 03/12/17 1340 5\' 7"  (1.702 m)     Head Circumference --      Peak Flow --      Pain Score 03/12/17 1341 2     Pain Loc --      Pain Edu? --      Excl. in GC? --    No data found.   Updated Vital Signs BP (!) 153/87 (BP Location: Left Arm)   Pulse 75   Temp 97.9 F (36.6 C) (Oral)   Resp 16   Ht 5\' 7"  (1.702 m)   Wt 190 lb (86.2 kg)   SpO2 98%   BMI 29.76 kg/m   Visual Acuity Right Eye Distance:   Left Eye Distance:   Bilateral Distance:    Right Eye Near:   Left Eye Near:    Bilateral Near:     Physical Exam Nursing notes and Vital Signs reviewed. Appearance:  Patient appears stated age, and in no acute distress.    Eyes:  Pupils are equal, round, and reactive to light and  accomodation.  Extraocular movement is intact.  Conjunctivae are not inflamed   Pharynx:  Normal; moist mucous membranes  Neck:  Supple.  No adenopathy Lungs:  Clear to auscultation.  Breath sounds are equal.  Moving air well. Heart:  Regular rate and rhythm without murmurs, rubs, or gallops.  Abdomen:   Vague mild tenderness right upper quadrant and costal margin without masses or hepatosplenomegaly.  Bowel sounds are present.  No CVA or flank tenderness.  Extremities:  No edema.  Skin:  No rash present.     UC Treatments / Results  Labs (all labs ordered are listed, but only abnormal results are displayed) Labs Reviewed  POCT URINALYSIS DIP (MANUAL ENTRY) - Abnormal; Notable for the following:       Result Value   Spec Grav, UA <=1.005 (*)    All other components within normal limits    EKG  EKG Interpretation None       Radiology No results found.  Procedures Procedures (including critical care time)  Medications Ordered in UC Medications - No data to display   Initial Impression / Assessment and Plan / UC Course  I have reviewed the triage vital signs and the nursing notes.  Pertinent labs & imaging results that were available during my care of the patient were reviewed by me and considered in my medical decision making (see chart for details).    Normal urinalysis without proteinuria reassuring. ?lab error source of abnormal renal tests.  Note tenderness over right costal margin; ?costochondritis as source of right upper quadrant pain. Recommend she follow-up with GI for continued work-up.  Repeat CMP w/GFR approximately 4 days. If persistent abnormal kidney function, follow-up with nephrologist.    Final Clinical Impressions(s) / UC Diagnoses   Final diagnoses:  Abnormal laboratory test result    New Prescriptions New Prescriptions   No medications on file         Lattie Haw, MD 03/20/17 [id number]

## 2017-03-12 NOTE — Discharge Instructions (Signed)
Repeat Comprehensive Metabolic Panel approximately 03/16/17.

## 2017-03-14 ENCOUNTER — Other Ambulatory Visit (INDEPENDENT_AMBULATORY_CARE_PROVIDER_SITE_OTHER): Payer: Managed Care, Other (non HMO)

## 2017-03-14 ENCOUNTER — Other Ambulatory Visit: Payer: Self-pay

## 2017-03-14 ENCOUNTER — Telehealth: Payer: Self-pay

## 2017-03-14 DIAGNOSIS — R799 Abnormal finding of blood chemistry, unspecified: Secondary | ICD-10-CM

## 2017-03-14 DIAGNOSIS — R7989 Other specified abnormal findings of blood chemistry: Secondary | ICD-10-CM

## 2017-03-14 LAB — BASIC METABOLIC PANEL
BUN: 17 mg/dL (ref 6–23)
CO2: 29 mEq/L (ref 19–32)
Calcium: 10.2 mg/dL (ref 8.4–10.5)
Chloride: 106 mEq/L (ref 96–112)
Creatinine, Ser: 0.8 mg/dL (ref 0.40–1.20)
GFR: 78.38 mL/min (ref >60.00)
Glucose, Bld: 99 mg/dL (ref 70–99)
POTASSIUM: 4.6 meq/L (ref 3.5–5.1)
Sodium: 139 mEq/L (ref 135–145)

## 2017-03-14 NOTE — Telephone Encounter (Signed)
Yes , that's fine- have her come in for BMET this week

## 2017-03-14 NOTE — Telephone Encounter (Signed)
Her CCK HIDA has been rescheduled to 03/15/18. Patient agrees to this. Referral coordinator notified.

## 2017-03-14 NOTE — Telephone Encounter (Signed)
Patient calls to inform us she does not have an actual PCP. She asks if you will repeat her kidney functions and send her to a specialist if the results are not WNL's

## 2017-03-15 ENCOUNTER — Ambulatory Visit (HOSPITAL_COMMUNITY)
Admission: RE | Admit: 2017-03-15 | Discharge: 2017-03-15 | Disposition: A | Discharge status: Home / Self Care | Payer: Managed Care, Other (non HMO) | Source: Ambulatory Visit | Attending: Physician Assistant | Admitting: Physician Assistant

## 2017-03-15 DIAGNOSIS — R1011 Right upper quadrant pain: Secondary | ICD-10-CM | POA: Diagnosis present

## 2017-03-15 MED ORDER — TECHNETIUM TC 99M MEBROFENIN IV KIT
4.9000 | PACK | Freq: Once | INTRAVENOUS | Status: AC | PRN
  Administered 2017-03-15: 4.9 via INTRAVENOUS

## 2017-03-17 ENCOUNTER — Ambulatory Visit (HOSPITAL_COMMUNITY): Payer: Managed Care, Other (non HMO)

## 2017-04-08 NOTE — Telephone Encounter (Signed)
The pt had labs and HIDA scan the results were sent to pt via My Chart per [facility]

## 2017-04-28 ENCOUNTER — Ambulatory Visit (INDEPENDENT_AMBULATORY_CARE_PROVIDER_SITE_OTHER): Payer: Managed Care, Other (non HMO) | Admitting: Podiatry

## 2017-04-28 ENCOUNTER — Encounter: Payer: Self-pay | Admitting: Podiatry

## 2017-04-28 VITALS — BP 137/69 | HR 78 | Resp 16

## 2017-04-28 DIAGNOSIS — B351 Tinea unguium: Secondary | ICD-10-CM

## 2017-04-28 DIAGNOSIS — M205X1 Other deformities of toe(s) (acquired), right foot: Secondary | ICD-10-CM

## 2017-04-28 MED ORDER — TERBINAFINE HCL 250 MG PO TABS: ORAL_TABLET | ORAL | 0 refills | Status: DC

## 2017-04-28 NOTE — Progress Notes (Signed)
   Subjective:    Patient ID: Paula [name], female    DOB: [birth year], 56 y.o.   MRN: [id number]  HPI    Review of Systems  All other systems reviewed and are negative.      Objective:   Physical Exam        Assessment & Plan:

## 2017-04-28 NOTE — Progress Notes (Signed)
Subjective:    Patient ID: Paula [name], female   DOB: 56 y.o.   MRN: [id number]   HPI patient presents with fungus of the left big toe and states that she's had it for years but it's gotten worse since the summer. Patient has been diagnosed with scleroderma    Review of Systems  All other systems reviewed and are negative.       Objective:  Physical Exam  Constitutional: She appears well-developed and well-nourished.  Cardiovascular: Intact distal pulses.   Pulmonary/Chest: Effort normal.  Musculoskeletal: Normal range of motion.  Neurological: She is alert.  Skin: Skin is warm.  Nursing note and vitals reviewed.  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 Patient will begin laser in the next several weeks

## 2017-05-03 ENCOUNTER — Encounter: Payer: Self-pay | Admitting: Gastroenterology

## 2017-05-03 ENCOUNTER — Ambulatory Visit (INDEPENDENT_AMBULATORY_CARE_PROVIDER_SITE_OTHER): Payer: 59 | Admitting: Gastroenterology

## 2017-05-03 ENCOUNTER — Ambulatory Visit (INDEPENDENT_AMBULATORY_CARE_PROVIDER_SITE_OTHER): Payer: 59 | Admitting: Sports Medicine

## 2017-05-03 VITALS — BP 132/78 | HR 72 | Ht 67.0 in | Wt 190.0 lb

## 2017-05-03 DIAGNOSIS — R079 Chest pain, unspecified: Secondary | ICD-10-CM

## 2017-05-03 DIAGNOSIS — K2930 Chronic superficial gastritis without bleeding: Secondary | ICD-10-CM

## 2017-05-03 DIAGNOSIS — M2021 Hallux rigidus, right foot: Secondary | ICD-10-CM | POA: Diagnosis not present

## 2017-05-03 DIAGNOSIS — R269 Unspecified abnormalities of gait and mobility: Secondary | ICD-10-CM | POA: Diagnosis not present

## 2017-05-03 DIAGNOSIS — M25579 Pain in unspecified ankle and joints of unspecified foot: Secondary | ICD-10-CM

## 2017-05-03 DIAGNOSIS — R1011 Right upper quadrant pain: Secondary | ICD-10-CM

## 2017-05-03 NOTE — Assessment & Plan Note (Signed)
Patient was fitted for a : standard, cushioned, semi-rigid orthotic. The orthotic was heated and afterward the patient stood on the orthotic blank positioned on the orthotic stand. The patient was positioned in subtalar neutral position and 10 degrees of ankle dorsiflexion in a weight bearing stance. After completion of molding, a stable base was applied to the orthotic blank. The blank was ground to a stable position for weight bearing. Size: 9 Red EVA Base:Medium blue EVA Posting: first Ray RT Additional orthotic padding:  MT pads bilat  Note her gait was neutral once we add the orthotics to shoes Feels good support  Reck prn if not controlling foot issues

## 2017-05-03 NOTE — Assessment & Plan Note (Signed)
Gets pretty good pain control with use of orthotics and softer wider shoe  Also uses topical meds  Transverse arch drop we added MT pads bilat

## 2017-05-03 NOTE — Patient Instructions (Signed)
Thank you for choosing me and [facility].  Malcolm T. Stark, Jr., MD., FACG  

## 2017-05-03 NOTE — Progress Notes (Signed)
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 Pes planus with standing Transverse arch loss bilat

## 2017-05-03 NOTE — Assessment & Plan Note (Signed)
Added first MTP cushion to orthtoics See if this lessens pain at MTP 1

## 2017-05-03 NOTE — Progress Notes (Signed)
    History of Present Illness: This is a 56 year old female returning after visit with Mike Gip, PAC with pain under right breat. She was treated for possible diverticulitis. Lipase=74. Complained of pain below her right breast and right upper quadrant pain. These symptoms have essentially resolved. She had questions about findings on her EGD from September 2017. Her rheumatologist was concerned, based on the endoscopic findings, that she had GAVE syndrome.  Abd [place] 02/2017 IMPRESSION: 4 mm gallbladder polyp. Per consensus guidelines, a polyp of this small size does not warrant additional imaging surveillance. Study otherwise unremarkable.  [facility]-HIDA 02/2017 IMPRESSION: 1. Negative nuclear medicine hepatobiliary scan. 2. Normal gallbladder ejection fraction of 42%. Some pain was noted after ingestion of Ensure.  EGD 03/2016: - Normal esophagus. Dilated. - Gastritis. Biopsied. - Normal duodenal bulb and second portion of the duodenum. Surgical [P], gastric antrum and gastric body -REACTIVE GASTROPATHY IN A SETTING OF CHRONIC GASTRITIS. -A WARTHIN-STARRY STAIN IS NEGATIVE FOR HELICOBACTER PYLORI. -NEGATIVE FOR INTESTINAL METAPLASIA OR MALIGNANCY.  Current Medications, Allergies, Past Medical History, Past Surgical History, Family History and Social History were reviewed in [employer] record.  Physical Exam: General: Well developed, well nourished, no acute distress Head: Normocephalic and atraumatic Eyes:  sclerae anicteric, EOMI Ears: Normal auditory acuity Mouth: No deformity or lesions Lungs: Clear throughout to auscultation Heart: Regular rate and rhythm; no murmurs, rubs or bruits Abdomen: Soft, non tender and non distended. No masses, hepatosplenomegaly or hernias noted. Normal Bowel sounds Rectal: Musculoskeletal: Symmetrical with no gross deformities  Pulses:  Normal pulses noted Extremities: No clubbing, cyanosis, edema or deformities  noted Neurological: Alert oriented x 4, grossly nonfocal Psychological:  Alert and cooperative. Normal mood and affect  Assessment and Recommendations:  1. Right chest pain, RUQ pain resolved. Etiology unclear. If symptoms recur evaluation for non-gastrointestinal etiology should be pursued by her PCP.  2. Chronic gastritis. Resume PPI daily if dyspeptic symptoms recur. Patient reassured she did not have GAVE syndrome.

## 2017-05-06 ENCOUNTER — Other Ambulatory Visit: Payer: Managed Care, Other (non HMO)

## 2017-05-30 ENCOUNTER — Other Ambulatory Visit: Payer: Self-pay | Admitting: Internal Medicine

## 2017-05-30 DIAGNOSIS — E049 Nontoxic goiter, unspecified: Secondary | ICD-10-CM

## 2017-06-03 ENCOUNTER — Ambulatory Visit
Admission: RE | Admit: 2017-06-03 | Discharge: 2017-06-03 | Disposition: A | Discharge status: Home / Self Care | Payer: 59 | Source: Ambulatory Visit | Attending: Internal Medicine | Admitting: Internal Medicine

## 2017-06-03 DIAGNOSIS — E049 Nontoxic goiter, unspecified: Secondary | ICD-10-CM

## 2017-06-30 ENCOUNTER — Ambulatory Visit (INDEPENDENT_AMBULATORY_CARE_PROVIDER_SITE_OTHER): Payer: Managed Care, Other (non HMO) | Admitting: Podiatry

## 2017-06-30 ENCOUNTER — Ambulatory Visit (INDEPENDENT_AMBULATORY_CARE_PROVIDER_SITE_OTHER): Payer: Managed Care, Other (non HMO)

## 2017-06-30 ENCOUNTER — Telehealth: Payer: Self-pay | Admitting: *Deleted

## 2017-06-30 ENCOUNTER — Encounter: Payer: Self-pay | Admitting: Podiatry

## 2017-06-30 DIAGNOSIS — M205X1 Other deformities of toe(s) (acquired), right foot: Secondary | ICD-10-CM

## 2017-06-30 DIAGNOSIS — M2010 Hallux valgus (acquired), unspecified foot: Secondary | ICD-10-CM

## 2017-06-30 NOTE — Progress Notes (Signed)
Subjective:   Patient ID: Paula [name], female   DOB: 56 y.o.   MRN: [id number]   HPI Patient states she is developing a lot of pain in her big toe joint of her right foot and it has been worsening over the last 6 months.  Patient states that she is tried shoe gear modification she is tried to soak it and use padding over top of the structural deformity but it is getting worse and she is lost the motion in the big toe joint over the last year   ROS      Objective:  Physical Exam  Neurovascular status intact with significant loss of motion right first MPJ with no crepitus of the joint noted but significant motion loss.  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  I do think given the significant condition over the last 6 months to 1 year that removal of the spurring with biplanar shortening plantarflexed the osteotomy would be in her best interest and I did explain there is no guarantee as to the condition of the cartilage and that it is possible long-term she will require fusion or joint implantation procedure.  At this point I did allow her to read consent form going over at great length the surgery alternative treatments and complications and patient wants surgery understanding risk.  Patient signed consent form and is scheduled for outpatient surgery we will continue to treat the nail disease that she is experiencing.  She understands total recovery will take 6 months to 1 year and today I did dispense air fracture walker for the postoperative.  X-rays indicate large spur of the first MPJ right with narrowing of the joint surface and elevated first  metatarsal over the left

## 2017-06-30 NOTE — Patient Instructions (Addendum)
Bunion A bunion is a bump on the base of the big toe that forms when the bones of the big toe joint move out of position. Bunions may be small at first, but they often get larger over time. The can make walking painful. What are the causes? A bunion may be caused by:  Wearing narrow or pointed shoes that force the big toe to press against the other toes.  Abnormal foot development that causes the foot to roll inward (pronate).  Changes in the foot that are caused by certain diseases, such as rheumatoid arthritis and polio.  A foot injury.  What increases the risk? The following factors may make you more likely to develop this condition:  Wearing shoes that squeeze the toes together.  Having certain diseases, such as: ? Rheumatoid arthritis. ? Polio. ? Cerebral palsy.  Having family members who have bunions.  Being born with a foot deformity, such as flat feet or low arches.  Doing activities that put a lot of pressure on the feet, such as ballet dancing.  What are the signs or symptoms? The main symptom of a bunion is a noticeable bump on the big toe. Other symptoms may include:  Pain.  Swelling around the big toe.  Redness and inflammation.  Thick or hardened skin on the big toe or between the toes.  Stiffness or loss of motion in the big toe.  Trouble with walking.  How is this diagnosed? A bunion may be diagnosed based on your symptoms, medical history, and activities. You may have tests, such as:  X-rays. These allow your health care provider to check the position of the bones in your foot and look for damage to your joint. They also help your health care provider to determine the severity of your bunion and the best way to treat it.  Joint aspiration. In this test, a sample of fluid is removed from the toe joint. This test, which may be done if you are in a lot of pain, helps to rule out diseases that cause painful swelling of the joints, such as  arthritis.  How is this treated? There is no cure for a bunion, but treatment can help to prevent a bunion from getting worse. Treatment depends on the severity of your symptoms. Your health care provider may recommend:  Wearing shoes that have a wide toe box.  Using bunion pads to cushion the affected area.  Taping your toes together to keep them in a normal position.  Placing a device inside your shoe (orthotics) to help reduce pressure on your toe joint.  Taking medicine to ease pain, inflammation, and swelling.  Applying heat or ice to the affected area.  Doing stretching exercises.  Surgery to remove scar tissue and move the toes back into their normal position. This treatment is rare.  Follow these instructions at home:  Support your toe joint with proper footwear, shoe padding, or taping as told by your health care provider.  Take over-the-counter and prescription medicines only as told by your health care provider.  If directed, apply ice to the injured area: ? Put ice in a plastic bag. ? Place a towel between your skin and the bag. ? Leave the ice on for 20 minutes, 2-3 times per day.  If directed, apply heat to the affected area before you exercise. Use the heat source that your health care provider recommends, such as a moist heat pack or a heating pad. ? Place a towel between your   skin and the heat source. ? Leave the heat on for 20-30 minutes. ? Remove the heat if your skin turns bright red. This is especially important if you are unable to feel pain, heat, or cold. You may have a greater risk of getting burned.  Do exercises as told by your health care provider.  Keep all follow-up visits as told by your health care provider. Contact a health care provider if:  Your symptoms get worse.  Your symptoms do not improve in 2 weeks. Get help right away if:  You have severe pain and trouble with walking. This information is not intended to replace advice given  to you by your health care provider. Make sure you discuss any questions you have with your health care provider. Document Released: 07/05/2005 Document Revised: 12/11/2015 Document Reviewed: 02/02/2015 Elsevier Interactive Patient Education  2018 [employer]  Pre-Operative Instructions  Congratulations, you have decided to take an important step towards improving your quality of life.  You can be assured that the doctors and staff at [facility] will be with you every step of the way.  Here are some important things you should know:  1. Plan to be at the surgery center/hospital at least 1 (one) hour prior to your scheduled time, unless otherwise directed by the surgical center/hospital staff.  You must have a responsible adult accompany you, remain during the surgery and drive you home.  Make sure you have directions to the surgical center/hospital to ensure you arrive on time. 2. If you are having surgery at [facility] or [facility], you will need a copy of your medical history and physical form from your family physician within one month prior to the date of surgery. We will give you a form for your primary physician to complete.  3. We make every effort to accommodate the date you request for surgery.  However, there are times where surgery dates or times have to be moved.  We will contact you as soon as possible if a change in schedule is required.   4. No aspirin/ibuprofen for one week before surgery.  If you are on aspirin, any non-steroidal anti-inflammatory medications (Mobic, Aleve, Ibuprofen) should not be taken seven (7) days prior to your surgery.  You make take Tylenol for pain prior to surgery.  5. Medications - If you are taking daily heart and blood pressure medications, seizure, reflux, allergy, asthma, anxiety, pain or diabetes medications, make sure you notify the surgery center/hospital before the day of surgery so they can tell you which medications you should  take or avoid the day of surgery. 6. No food or drink after midnight the night before surgery unless directed otherwise by surgical center/hospital staff. 7. No alcoholic beverages 24-hours prior to surgery.  No smoking 24-hours prior or 24-hours after surgery. 8. Wear loose pants or shorts. They should be loose enough to fit over bandages, boots, and casts. 9. Don't wear slip-on shoes. Sneakers are preferred. 10. Bring your boot with you to the surgery center/hospital.  Also bring crutches or a walker if your physician has prescribed it for you.  If you do not have this equipment, it will be provided for you after surgery. 11. If you have not been contacted by the surgery center/hospital by the day before your surgery, call to confirm the date and time of your surgery. 12. Leave-time from work may vary depending on the type of surgery you have.  Appropriate arrangements should be made prior   to surgery with your employer. 13. Prescriptions will be provided immediately following surgery by your doctor.  Fill these as soon as possible after surgery and take the medication as directed. Pain medications will not be refilled on weekends and must be approved by the doctor. 14. Remove nail polish on the operative foot and avoid getting pedicures prior to surgery. 15. Wash the night before surgery.  The night before surgery wash the foot and leg well with water and the antibacterial soap provided. Be sure to pay special attention to beneath the toenails and in between the toes.  Wash for at least three (3) minutes. Rinse thoroughly with water and dry well with a towel.  Perform this wash unless told not to do so by your physician.  Enclosed: 1 Ice pack (please put in freezer the night before surgery)   1 Hibiclens skin cleaner   Pre-op instructions  If you have any questions regarding the instructions, please do not hesitate to call our office.  [place]: [address], [place], [place] [address] --  [phone]  [place]: [address] [place], [place] [address] -- [phone]  [place]: [address]  [place], [place] [address] -- [phone]  [place]: [address], [address], [place], [place] [address] -- [phone]  Website: [email or web address]  

## 2017-06-30 NOTE — Telephone Encounter (Signed)
"  I'd like to schedule my surgery for the first half of January."  Dr. Beverlee Nimsegal's next available is January 22.  "Okay, I'm going to have to push it to February.  Can he do it on February 4?"  Yes, he can do it on February 4.  "I think my husband may be scheduled for his colonoscopy.  I will call if I need to reschedule it."

## 2017-07-28 ENCOUNTER — Telehealth: Payer: Self-pay | Admitting: *Deleted

## 2017-07-28 NOTE — Telephone Encounter (Signed)
"  I have a bunion surgery scheduled with Dr. Charlsie Merlesegal on February 5 that I can't keep.  If you could return my call."

## 2017-07-28 NOTE — Telephone Encounter (Signed)
I attempted to return her call.  I left her a message to call me back. 

## 2017-07-28 NOTE — Telephone Encounter (Signed)
"  I need to take myself off Dr. Beverlee Nimsegal's schedule for surgery on February 5.  I have a pinched nerve in my back that I have to take care of first.  I will call back to reschedule once I get everything straightened out."  I will let Dr. Charlsie Merlesegal know and cancel it at the surgical center.  I called and left Aram BeechamCynthia, scheduler at the surgical center, a message to cancel the patient's surgery scheduled for August 23, 2017.

## 2017-09-26 ENCOUNTER — Ambulatory Visit: Payer: Managed Care, Other (non HMO) | Admitting: Podiatry

## 2017-10-17 ENCOUNTER — Ambulatory Visit (INDEPENDENT_AMBULATORY_CARE_PROVIDER_SITE_OTHER): Payer: 59

## 2017-10-17 ENCOUNTER — Ambulatory Visit (INDEPENDENT_AMBULATORY_CARE_PROVIDER_SITE_OTHER): Payer: 59 | Admitting: Podiatry

## 2017-10-17 ENCOUNTER — Encounter: Payer: Self-pay | Admitting: Podiatry

## 2017-10-17 DIAGNOSIS — M205X1 Other deformities of toe(s) (acquired), right foot: Secondary | ICD-10-CM

## 2017-10-17 DIAGNOSIS — T148XXA Other injury of unspecified body region, initial encounter: Secondary | ICD-10-CM | POA: Diagnosis not present

## 2017-10-17 NOTE — Progress Notes (Signed)
Subjective:   Patient ID: Paula [name], female   DOB: 57 y.o.   MRN: [id number]   HPI Patient presents with the possibility for a torn tendon in the left foot and states that she saw another physician who gave her diagnosis of this is possibility and also the diagnosis of a possible stress fracture.  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  I did muscle strength testing and I did not note any loss of function of the posterior tibial tendon at the current time     Assessment:  Posterior tibial tendinitis with possibility for attenuation of the tendon and hallux limitus right     Plan:  H&P x-rays reviewed of the left and at this point will gradually reduce the boot and I did apply fascial brace to try to lift the arch.  Discussed correction of the right foot which she promises to do not do the current time.  Patient will be seen back in approximately 4 weeks or earlier if any issues were to occur she understands we may need to order MRI if this remains a persistent problem or if she has instability  X-rays were negative for signs of fracture or bone pathology

## 2017-10-28 NOTE — Progress Notes (Signed)
Office Visit Note  Patient: Paula [name]             Date of Birth: January 19, 1961           MRN: [id number]             PCP: Jones Bales, MD Referring: Jones Bales, MD Visit Date: 11/07/2017 Occupation: @GUAROCC @    Subjective:  Crest syndrome.   History of Present Illness: Paula [name] is a 57 y.o. female seen in consultation per request of her PCP.  According to patient and fall 2016 she started having symptoms of rainouts phenomenon.  She also recalls she had a tick bite that year.  In February 2017 she was seen by her PCP at the time she had symptoms of fatigue, joint pain and rainouts phenomenon.  She states she had extensive lab work where she had positive anticentromere antibody, her EBV titer, Lyme titer and mycoplasma titers were positive.  According to patient she received treatment for EBV, Lyme and mycoplasma.  She was also referred to Dr.Syed who confirmed the diagnosis of crest syndrome.  She was referred to Dr. Jacinto Halim for echocardiogram who was concerned about possible pulmonary hypertension.  She was referred to [facility] pulmonary where she had extensive work-up which was negative and was told that she did not have pulmonary hypertension.  She had one year follow-up with Dr. Jacinto Halim and her pulmonary pressures were stable.  She also had GI work-up which include barium swallow and endoscopy according to patient that was normal.  She states in the last 6 weeks she has been experiencing increased hand stiffness, some changes in her cuticles, skin thickening and recurrence of rainouts phenomenon.  She was started on amlodipine and her symptoms have improved.  Activities of Daily Living:  Patient reports morning stiffness for 30 minutes.   Patient Denies nocturnal pain.  Difficulty dressing/grooming: Denies Difficulty climbing stairs: Denies Difficulty getting out of chair: Denies Difficulty using hands for taps, buttons, cutlery, and/or writing: Denies   Review of  Systems  Constitutional: Positive for fatigue. Negative for night sweats, weight gain and weight loss.  HENT: Negative for mouth sores, trouble swallowing, trouble swallowing, mouth dryness and nose dryness.   Eyes: Negative for pain, redness, visual disturbance and dryness.  Respiratory: Negative for cough, shortness of breath and difficulty breathing.   Cardiovascular: Negative for chest pain, palpitations, hypertension, irregular heartbeat and swelling in legs/feet.  Gastrointestinal: Negative for blood in stool, constipation and diarrhea.  Endocrine: Negative for increased urination.  Genitourinary: Negative for vaginal dryness.  Musculoskeletal: Positive for arthralgias, joint pain, joint swelling and morning stiffness. Negative for myalgias, muscle weakness, muscle tenderness and myalgias.  Skin: Positive for color change and skin tightness. Negative for rash, hair loss, ulcers and sensitivity to sunlight.  Allergic/Immunologic: Negative for susceptible to infections.  Neurological: Negative for dizziness, memory loss, night sweats and weakness.  Hematological: Negative for swollen glands.  Psychiatric/Behavioral: Negative for depressed mood and sleep disturbance. The patient is not nervous/anxious.     PMFS History:  Patient Active Problem List   Diagnosis Date Noted  . Limited systemic sclerosis (HCC) 11/07/2017  . Primary osteoarthritis of both hands 11/07/2017  . Chondromalacia of patella 11/07/2017  . Primary osteoarthritis of both feet 11/07/2017  . History of Epstein-Barr virus infection 11/07/2017  . History of diverticulosis 11/07/2017  . History of mycoplasma pneumonia 11/07/2017  . History of hypothyroidism 11/07/2017  . Hallux rigidus of right foot 05/03/2017  . Pain in  joint, ankle and foot 04/17/2014  . Abnormality of gait 04/17/2014  . Elevated parathyroid hormone 12/18/2013  . Thyroid disease     Past Medical History:  Diagnosis Date  . Anxiety   . Arthritis    . CREST syndrome (HCC)   . Diverticulosis   . Gastric ulcer   . Hypercholesterolemia   . Lyme disease   . Thyroid disease   . Vitamin B deficiency     Family History  Problem Relation Age of Onset  . Diabetes Sister   . Healthy Daughter   . Healthy Son   . Colon cancer Neg Hx   . Pancreatic cancer Neg Hx   . Rectal cancer Neg Hx   . Stomach cancer Neg Hx    Past Surgical History:  Procedure Laterality Date  . KNEE SURGERY  2007  . OOPHORECTOMY  2004  . SHOULDER SURGERY  03/2012   Social History   Social History Narrative   ** Merged History Encounter **         Objective: Vital Signs: BP 135/82 (BP Location: Left Arm, Patient Position: Sitting, Cuff Size: Normal)   Pulse 80   Resp 14   Ht 5\' 7"  (1.702 m)   Wt 201 lb (91.2 kg)   BMI 31.48 kg/m    Physical Exam  Constitutional: She is oriented to person, place, and time. She appears well-developed and well-nourished.  HENT:  Head: Normocephalic and atraumatic.  Eyes: Conjunctivae and EOM are normal.  Neck: Normal range of motion.  Cardiovascular: Normal rate, regular rhythm, normal heart sounds and intact distal pulses.  Pulmonary/Chest: Effort normal and breath sounds normal.  Abdominal: Soft. Bowel sounds are normal.  Lymphadenopathy:    She has no cervical adenopathy.  Neurological: She is alert and oriented to person, place, and time.  Skin: Skin is warm and dry. Capillary refill takes less than 2 seconds.  This could thickening noted proximal to MCPs.  Periungual erythema and capillary nailfold changes noted.  Psychiatric: She has a normal mood and affect. Her behavior is normal.  Nursing note and vitals reviewed.    Musculoskeletal Exam: C-spine thoracic lumbar spine good range of motion.  Shoulder joints all the joints wrist joints are good range of motion.  She has DIP PIP thickening bilaterally with mucinous cyst present on the right first DIP joint.  She also has inflammatory component to her left  second PIP joint.  PIP joints, knee joints but good range of motion.  She is crepitus in her knee joints.  She has bilateral first hallux rigidus.  Some PIP DIP changes were noted in her feet as well.  CDAI Exam: No CDAI exam completed.    Investigation: No additional findings. CBC Latest Ref Rng & Units 03/09/2017  WBC 4.0 - 10.5 K/uL 6.4  Hemoglobin 12.0 - 15.0 g/dL 13.014.8  Hematocrit 86.536.0 - 46.0 % 44.8  Platelets 150.0 - 400.0 K/uL 244.0   CMP Latest Ref Rng & Units 03/14/2017 03/09/2017  Glucose 70 - 99 mg/dL 99 99  BUN 6 - 23 mg/dL 17 78(I26(H)  Creatinine 6.960.40 - 1.20 mg/dL 2.950.80 2.84(X1.77(H)  Sodium 324135 - 145 mEq/L 139 137  Potassium 3.5 - 5.1 mEq/L 4.6 4.3  Chloride 96 - 112 mEq/L 106 104  CO2 19 - 32 mEq/L 29 28  Calcium 8.4 - 10.5 mg/dL 40.110.2 02.710.3  Total Protein 6.0 - 8.3 g/dL - 7.1  Total Bilirubin 0.2 - 1.2 mg/dL - 0.4  Alkaline Phos 39 - 117  U/L - 70  AST 0 - 37 U/L - 17  ALT 0 - 35 U/L - 20  February 24, 2017 advised labs showed ANA +1: 2560, (dsDNA, Smith, CB-CAP, anti-U1RNP, RNP, SSA, SSB, SCL 70, Jo 1-) anticentromere positive, RF positive, anti-CCP and anti-MCV negative, anticardiolipin negative, beta-2 negative, antithyroglobulin negative, anti-phosphatidylserine IgM 90  Imaging: Dg Foot Complete Left  Result Date: 10/17/2017 Please see detailed radiograph report in office note.   Speciality Comments: No specialty comments available.    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  Patient has been under care of rheumatologist for the last 2 years.  Her disease has been slowly progressive.  She also had cardiology work-up with stable pressures per patient.  She is also had pulmonary work-up at [facility] according to patient it was negative.  She wants to establish  care here.  She brought all of her records which were reviewed.  She has positive ANA, anticentromere antibody and positive RF.  Her raynaud's phenominon is improved on amlodipine.  Keeping body temperature warm was discussed at length.  She will come back once a year for follow-up.  She will forward labs from her PCP which will include CBC CMP and UA once a year.  Monitoring of blood pressure was emphasized.  Patient stated that she would to establish with cardiologist and pulmonologist in [place] system.  She will notify me when she needs referrals.  History of hypothyroidism  Primary osteoarthritis of both hands she has severe osteoarthritis in her hands with inflammatory component-.  She has some ongoing stiffness in her hands. Chondromalacia of patella, unspecified laterality-per patient I do not have records to confirm.  Primary osteoarthritis of both feet-she has clinical features of osteoarthritis in her feet.  History of Epstein-Barr virus infection  History of diverticulosis  History of mycoplasma pneumonia    Orders: No orders of the defined types were placed in this encounter.  No orders of the defined types were placed in this encounter.   Face-to-face time spent with patient was 50 minutes.  Greater than 50% of time was spent in counseling and coordination of care.  Follow-Up Instructions: Return for Limited systemic sclerosis, osteoarthritis.   Pollyann Savoy, MD  Note - This record has been created using [employer].  Chart creation errors have been sought, but may not always  have been located. Such creation errors do not reflect on  the standard of medical care.

## 2017-11-07 ENCOUNTER — Encounter: Payer: Self-pay | Admitting: Rheumatology

## 2017-11-07 ENCOUNTER — Ambulatory Visit (INDEPENDENT_AMBULATORY_CARE_PROVIDER_SITE_OTHER): Payer: 59 | Admitting: Rheumatology

## 2017-11-07 ENCOUNTER — Telehealth: Payer: Self-pay | Admitting: Rheumatology

## 2017-11-07 VITALS — BP 135/82 | HR 80 | Resp 14 | Ht 67.0 in | Wt 201.0 lb

## 2017-11-07 DIAGNOSIS — M19071 Primary osteoarthritis, right ankle and foot: Secondary | ICD-10-CM

## 2017-11-07 DIAGNOSIS — Z8719 Personal history of other diseases of the digestive system: Secondary | ICD-10-CM | POA: Diagnosis not present

## 2017-11-07 DIAGNOSIS — Z8639 Personal history of other endocrine, nutritional and metabolic disease: Secondary | ICD-10-CM

## 2017-11-07 DIAGNOSIS — M19041 Primary osteoarthritis, right hand: Secondary | ICD-10-CM | POA: Diagnosis not present

## 2017-11-07 DIAGNOSIS — M349 Systemic sclerosis, unspecified: Secondary | ICD-10-CM

## 2017-11-07 DIAGNOSIS — M2240 Chondromalacia patellae, unspecified knee: Secondary | ICD-10-CM | POA: Diagnosis not present

## 2017-11-07 DIAGNOSIS — M19072 Primary osteoarthritis, left ankle and foot: Secondary | ICD-10-CM

## 2017-11-07 DIAGNOSIS — Z8701 Personal history of pneumonia (recurrent): Secondary | ICD-10-CM | POA: Diagnosis not present

## 2017-11-07 DIAGNOSIS — Z8619 Personal history of other infectious and parasitic diseases: Secondary | ICD-10-CM | POA: Diagnosis not present

## 2017-11-07 DIAGNOSIS — M19042 Primary osteoarthritis, left hand: Secondary | ICD-10-CM | POA: Diagnosis not present

## 2017-11-07 NOTE — Telephone Encounter (Signed)
Patient calling in to let you know she would like to see Dr. Kendrick FriesMcQuaid, and schedule a Pulmonary Function Test. Please call patient to advise.

## 2017-11-11 ENCOUNTER — Ambulatory Visit: Payer: 59 | Admitting: Podiatry

## 2017-11-21 NOTE — Telephone Encounter (Signed)
Patient called stating she was under the impression she was only going to see Dr. Kendrick Fries for a pulmonary function test and not a consultation. Please advise.

## 2017-11-22 NOTE — Telephone Encounter (Signed)
She needs a consultation

## 2017-11-22 NOTE — Telephone Encounter (Signed)
Patient called back to see if doctor had responded to her previous message. I advised patient doctor wants her to have a consultation with Pulmonary doctor. Patient will contact their office again to schedule appt.

## 2017-11-23 NOTE — Telephone Encounter (Signed)
LMOM for patient to have consultation not PFT per Dr. Corliss Skains, referral will not be changed to PFT

## 2017-11-28 NOTE — Progress Notes (Signed)
Office Visit Note  Patient: Paula [name]             Date of Birth: 12-22-1960           MRN: [id number]             PCP: Paula Bales, MD Referring: No ref. provider found Visit Date: 12/06/2017 Occupation: @    Subjective:  Follow-up [facility])   History of Present Illness: Paula [name] is a 57 y.o. female with history of limited systemic sclerosis and osteoarthritis.  She states she has not noticed much of her rainouts symptoms currently due to warmer weather.  She has tried amlodipine in the past but this stopped it due to constipation.  She has been experiencing some stiffness in her hands for the last 2 months.  She states her right hand states is stiff almost all day.  Due to chondromalacia patella she does have difficulty climbing the stairs.  Activities of Daily Living:  Patient reports morning stiffness for 2 hours.   Patient Denies nocturnal pain.  Difficulty dressing/grooming: Denies Difficulty climbing stairs: Reports Difficulty getting out of chair: Denies Difficulty using hands for taps, buttons, cutlery, and/or writing: Reports   Review of Systems  Constitutional: Positive for fatigue. Negative for night sweats, weight gain and weight loss.  HENT: Negative for mouth sores, trouble swallowing, trouble swallowing, mouth dryness and nose dryness.   Eyes: Negative for pain, redness, visual disturbance and dryness.  Respiratory: Negative for cough, shortness of breath and difficulty breathing.   Cardiovascular: Negative for chest pain, palpitations, hypertension, irregular heartbeat and swelling in legs/feet.  Gastrointestinal: Negative for blood in stool, constipation and diarrhea.  Endocrine: Negative for excessive thirst and increased urination.  Genitourinary: Negative for difficulty urinating and vaginal dryness.  Musculoskeletal: Positive for arthralgias, joint pain and morning stiffness. Negative for joint swelling, myalgias, muscle  weakness, muscle tenderness and myalgias.  Skin: Positive for color change. Negative for rash, hair loss, skin tightness, ulcers and sensitivity to sunlight.  Allergic/Immunologic: Negative for susceptible to infections.  Neurological: Negative for dizziness, memory loss, night sweats and weakness.  Hematological: Negative for bruising/bleeding tendency and swollen glands.  Psychiatric/Behavioral: Negative for depressed mood and sleep disturbance. The patient is not nervous/anxious.     PMFS History:  Patient Active Problem List   Diagnosis Date Noted  . Limited systemic sclerosis (HCC) 11/07/2017  . Primary osteoarthritis of both hands 11/07/2017  . Chondromalacia of patella 11/07/2017  . Primary osteoarthritis of both feet 11/07/2017  . History of Epstein-Barr virus infection 11/07/2017  . History of diverticulosis 11/07/2017  . History of mycoplasma pneumonia 11/07/2017  . History of hypothyroidism 11/07/2017  . Hallux rigidus of right foot 05/03/2017  . Pain in joint, ankle and foot 04/17/2014  . Abnormality of gait 04/17/2014  . Elevated parathyroid hormone 12/18/2013  . Thyroid disease     Past Medical History:  Diagnosis Date  . Anxiety   . Arthritis   . CREST syndrome (HCC)   . Diverticulosis   . Gastric ulcer   . Hypercholesterolemia   . Lyme disease   . Thyroid disease   . Vitamin B deficiency     Family History  Problem Relation Age of Onset  . Diabetes Sister   . Healthy Daughter   . Healthy Son   . Colon cancer Neg Hx   . Pancreatic cancer Neg Hx   . Rectal cancer Neg Hx   . Stomach cancer Neg Hx    Past  Surgical History:  Procedure Laterality Date  . KNEE ARTHROPLASTY    . KNEE SURGERY  2007  . OOPHORECTOMY  2004  . SHOULDER SURGERY  03/2012  . TOTAL SHOULDER ARTHROPLASTY     Social History   Social History Narrative   ** Merged History Encounter **         Objective: Vital Signs: BP 120/71 (BP Location: Left Arm, Patient Position: Sitting,  Cuff Size: Normal)   Pulse 71   Resp 14   Ht  (1.702 m)   Wt 196 lb (88.9 kg)   BMI 30.70 kg/m    Physical Exam  Constitutional: She is oriented to person, place, and time. She appears well-developed and well-nourished.  HENT:  Head: Normocephalic and atraumatic.  Eyes: Conjunctivae and EOM are normal.  Neck: Normal range of motion.  Cardiovascular: Normal rate, regular rhythm, normal heart sounds and intact distal pulses.  Pulmonary/Chest: Effort normal and breath sounds normal.  Abdominal: Soft. Bowel sounds are normal.  Lymphadenopathy:    She has no cervical adenopathy.  Neurological: She is alert and oriented to person, place, and time.  Skin: Skin is warm and dry. Capillary refill takes less than 2 seconds.  Nailfold capillary changes, Telengectesia  Psychiatric: She has a normal mood and affect. Her behavior is normal.  Nursing note and vitals reviewed.    Musculoskeletal Exam: C-spine limited rotation.  Thoracic and lumbar spine good range of motion.  Shoulder joints elbow joints wrist joints are good range of motion.  She has DIP PIP thickening in her bilateral hands.  She also has DIP PIP thickening in her feet.  She is crepitus in her bilateral knee joints without any swelling or effusion.  CDAI Exam: No CDAI exam completed.    Investigation: Findings:  February 24, 2017 [facility] labs showed ANA +1: 2560, (dsDNA, Smith, CB-CAP, anti-U1RNP, RNP, SSA, SSB, SCL 70, Jo 1-) anticentromere positive, RF positive, anti-CCP and anti-MCV negative, anticardiolipin negative, beta-2 negative, antithyroglobulin negative, anti-phosphatidylserine IgM 90  Time spent in reviewing records, and labs prior to visit was 15 minutes  Imaging: No results found.  Speciality Comments: No specialty comments available.    Procedures:  No procedures performed Allergies: Celebrex [celecoxib] and Vioxx [rofecoxib]   Assessment / Plan:     Visit Diagnoses: Limited systemic sclerosis (HCC)  - sclerodactyly, nail fold capillary changes, raynaud's phenominon, fatigue, few telangiectasias.positive ANA, anticentromere +, anti-APL+ and positive RF.  Raynaud's disease without gangrene - +APS Ab.  She stopped amlodipine due to constipation.  She states she might restart it during the wintertime.  Primary osteoarthritis of both hands-she continues to have pain and stiffness in her hands.  Joint protection muscle strengthening was discussed.  Primary osteoarthritis of both feet-she is not having much discomfort in her feet currently.  Chondromalacia of patella, unspecified laterality-she continues to have some discomfort in patellofemoral joint and has difficulty with the stairs.  Knee joint muscle strengthening was discussed.  Natural anti-inflammatories were discussed.  History of diverticulosis  History of hypothyroidism  History of Epstein-Barr virus infection  History of mycoplasma pneumonia    Orders: No orders of the defined types were placed in this encounter.  No orders of the defined types were placed in this encounter.   Face-to-face time spent with patient was 30 minutes. >50% of time was spent in counseling and coordination of care.  Follow-Up Instructions: Return in about 6 months (around 06/08/2018) for Osteoarthritis, Scleroderma.   Pollyann Savoy, MD  Note -  This record has been created using [employer].  Chart creation errors have been sought, but may not always  have been located. Such creation errors do not reflect on  the standard of medical care.

## 2017-12-06 ENCOUNTER — Encounter: Payer: Self-pay | Admitting: Rheumatology

## 2017-12-06 ENCOUNTER — Ambulatory Visit (INDEPENDENT_AMBULATORY_CARE_PROVIDER_SITE_OTHER): Payer: 59 | Admitting: Rheumatology

## 2017-12-06 VITALS — BP 120/71 | HR 71 | Resp 14 | Ht 67.0 in | Wt 196.0 lb

## 2017-12-06 DIAGNOSIS — M19071 Primary osteoarthritis, right ankle and foot: Secondary | ICD-10-CM | POA: Diagnosis not present

## 2017-12-06 DIAGNOSIS — M349 Systemic sclerosis, unspecified: Secondary | ICD-10-CM

## 2017-12-06 DIAGNOSIS — Z8619 Personal history of other infectious and parasitic diseases: Secondary | ICD-10-CM

## 2017-12-06 DIAGNOSIS — M2240 Chondromalacia patellae, unspecified knee: Secondary | ICD-10-CM | POA: Diagnosis not present

## 2017-12-06 DIAGNOSIS — M19042 Primary osteoarthritis, left hand: Secondary | ICD-10-CM | POA: Diagnosis not present

## 2017-12-06 DIAGNOSIS — Z8701 Personal history of pneumonia (recurrent): Secondary | ICD-10-CM

## 2017-12-06 DIAGNOSIS — Z8639 Personal history of other endocrine, nutritional and metabolic disease: Secondary | ICD-10-CM

## 2017-12-06 DIAGNOSIS — M19041 Primary osteoarthritis, right hand: Secondary | ICD-10-CM | POA: Diagnosis not present

## 2017-12-06 DIAGNOSIS — Z8719 Personal history of other diseases of the digestive system: Secondary | ICD-10-CM | POA: Diagnosis not present

## 2017-12-06 DIAGNOSIS — I7300 Raynaud's syndrome without gangrene: Secondary | ICD-10-CM

## 2017-12-06 DIAGNOSIS — M19072 Primary osteoarthritis, left ankle and foot: Secondary | ICD-10-CM | POA: Diagnosis not present

## 2017-12-06 NOTE — Patient Instructions (Signed)
Natural anti-inflammatories  You can purchase these at [employer], [employer] or online.  . Turmeric (capsules)  . Ginger (ginger root or capsules)  . Omega 3 (Fish, flax seeds, chia seeds, walnuts, almonds)  . Tart cherry (dried or extract or tablets)   Patient should be under the care of a physician while taking these supplements. This may not be reproduced without the permission of Dr. Nicle Connole.  

## 2017-12-27 ENCOUNTER — Ambulatory Visit (INDEPENDENT_AMBULATORY_CARE_PROVIDER_SITE_OTHER): Payer: 59 | Admitting: Pulmonary Disease

## 2017-12-27 ENCOUNTER — Encounter: Payer: Self-pay | Admitting: Pulmonary Disease

## 2017-12-27 VITALS — BP 130/78 | HR 70 | Ht 67.0 in | Wt 200.0 lb

## 2017-12-27 DIAGNOSIS — I272 Pulmonary hypertension, unspecified: Secondary | ICD-10-CM

## 2017-12-27 DIAGNOSIS — M349 Systemic sclerosis, unspecified: Secondary | ICD-10-CM | POA: Diagnosis not present

## 2017-12-27 NOTE — Progress Notes (Signed)
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  She was initially evaluated by [facility] cardiology and rheumatology.  She had a prior history of Raynaud's phenomena.  Subjective:   PATIENT ID: Paula [name] GENDER: female DOB: April 19, 1961, MRN: [id number]   HPI  Chief Complaint  Patient presents with  . New Consult    Scleroderma     Paula [name] comes to my clinic today to establish care in the setting of systemic sclerosis.  She says that she had a normal childhood without respiratory illnesses and she has no family history of an underlying lung disease.  In 2017 she had "a horrible year".  Ill, had low energy and some skin changes around the time of a tick bite.  Lab work revealed worrisome for systemic sclerosis.  Since then she has had little progression of disease though apparently she does have some skin involvement on her fingertips.  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  During this time she said no respiratory complaints.  Specifically no cough or no shortness of breath.  There is no ankle swelling, there is no presyncope with movement chest tightness.  2 years ago colleagues at [facility] pulmonary hypertension clinic where seen because an echocardiogram had shown a slightly elevated pulmonary artery estimated pressure.  The assessment at that time was that there is no evidence of pulmonary arteriopathy and the findings from the echocardiogram were likely representative of some degree of diastolic dysfunction consistent with her age and weight.  Past Medical History:  Diagnosis Date  . Anxiety   . Arthritis   . CREST syndrome (HCC)   . Diverticulosis   . Gastric ulcer   . Hypercholesterolemia   . Lyme disease   . Thyroid disease   . Vitamin B deficiency      Family History  Problem  Relation Age of Onset  . Diabetes Sister   . Healthy Daughter   . Healthy Son   . Colon cancer Neg Hx   . Pancreatic cancer Neg Hx   . Rectal cancer Neg Hx   . Stomach cancer Neg Hx      Social History   Socioeconomic History  . Marital status: Married    Spouse name: Not on file  . Number of children: Not on file  . Years of education: Not on file  . Highest education level: Not on file  Occupational History  . Not on file  Social Needs  . Financial resource strain: Not on file  . Food insecurity:    Worry: Not on file    Inability: Not on file  . Transportation needs:    Medical: Not on file    Non-medical: Not on file  Tobacco Use  . Smoking status: Former Smoker    Types: Cigarettes  . Smokeless tobacco: Never Used  . Tobacco comment: college  Substance and Sexual Activity  . Alcohol use: Not Currently  . Drug use: No  . Sexual activity: Not on file  Lifestyle  . Physical activity:    Days per week: Not on file    Minutes per session: Not on file  . Stress: Not on file  Relationships  . Social connections:    Talks on phone: Not on file    Gets together: Not on file    Attends religious  service: Not on file    Active member of club or organization: Not on file    Attends meetings of clubs or organizations: Not on file    Relationship status: Not on file  . Intimate partner violence:    Fear of current or ex partner: Not on file    Emotionally abused: Not on file    Physically abused: Not on file    Forced sexual activity: Not on file  Other Topics Concern  . Not on file  Social History Narrative   ** Merged History Encounter **         Allergies  Allergen Reactions  . Celebrex [Celecoxib] Swelling  . Vioxx [Rofecoxib]      Outpatient Medications Prior to Visit  Medication Sig Dispense Refill  . Ascorbic Acid (VITAMIN C) 1000 MG tablet Take 1,000 mg by mouth daily.    . Calcium Carbonate (CALCIUM 600 PO) Take 600 mg by mouth daily.    .  Cholecalciferol (VITAMIN D PO) Take 7,500 mg by mouth daily.    Marland Kitchen CLINDAMYCIN PHOSPHATE IV Inject 800 mg into the vein every 30 (thirty) days.    . fluconazole (DIFLUCAN) 100 MG tablet every other day.  5  . levothyroxine (SYNTHROID) 88 MCG tablet Take 1 tablet by mouth daily. 6 days a week    . liothyronine (CYTOMEL) 25 MCG tablet Take 37.5 mcg by mouth daily.     . Magnesium 300 MG CAPS Take by mouth daily.    . Menaquinone-7 (VITAMIN K2 PO) Take 500 mcg by mouth daily.    . minocycline (MINOCIN,DYNACIN) 100 MG capsule 200 mg every other day.  5  . NALTREXONE HCL PO Take 4.5 mg by mouth daily.    Marland Kitchen OVER THE COUNTER MEDICATION     . OVER THE COUNTER MEDICATION 12.5 mg daily.    Marland Kitchen OVER THE COUNTER MEDICATION     . Probiotic Product (PROBIOTIC PO) Take by mouth daily.    . progesterone (PROMETRIUM) 100 MG capsule     . progesterone (PROMETRIUM) 200 MG capsule TK ONE C PO NIGHTLY B BED, with estrogen and testosterone  3  . vitamin E 400 UNIT capsule Take 800 Units by mouth daily.    Marland Kitchen amLODipine (NORVASC) 5 MG tablet TK 1 T PO ONCE A DAY  0  . aspirin 81 MG tablet Take 81 mg by mouth daily.    Marland Kitchen OVER THE COUNTER MEDICATION 2 (two) times daily.     Facility-Administered Medications Prior to Visit  Medication Dose Route Frequency Provider Last Rate Last Dose  . 0.9 %  sodium chloride infusion  500 mL Intravenous Continuous Meryl Dare, MD        Review of Systems  Constitutional: Negative for fever and weight loss.  HENT: Positive for congestion. Negative for ear pain, nosebleeds and sore throat.   Eyes: Positive for redness.  Respiratory: Positive for wheezing. Negative for cough and shortness of breath.   Cardiovascular: Negative for palpitations, leg swelling and PND.  Gastrointestinal: Negative for nausea and vomiting.  Genitourinary: Negative for dysuria.  Skin: Negative for rash.  Neurological: Negative for headaches.  Endo/Heme/Allergies: Does not bruise/bleed easily.    Psychiatric/Behavioral: Negative for depression. The patient is nervous/anxious.       Objective:  Physical Exam   Vitals:   12/27/17 1356  BP: 130/78  Pulse: 70  SpO2: 99%  Weight: 200 lb (90.7 kg)  Height: 5\' 7"  (1.702 m)   RA  Gen: well appearing, no acute distress HENT: NCAT, OP clear, neck supple without masses Eyes: PERRL, EOMi Lymph: no cervical lymphadenopathy PULM: CTA B CV: RRR, no mgr, no JVD GI: BS+, soft, nontender, no hsm Derm: no rash or skin breakdown MSK: normal bulk and tone Neuro: A&Ox4, CN II-XII intact, strength 5/5 in all 4 extremities Psyche: normal mood and affect *  CBC    Component Value Date/Time   WBC 6.4 03/09/2017 1516   RBC 4.79 03/09/2017 1516   HGB 14.8 03/09/2017 1516   HCT 44.8 03/09/2017 1516   PLT 244.0 03/09/2017 1516   MCV 93.6 03/09/2017 1516   MCHC 32.9 03/09/2017 1516   RDW 14.1 03/09/2017 1516   LYMPHSABS 1.0 03/09/2017 1516   MONOABS 0.6 03/09/2017 1516   EOSABS 0.1 03/09/2017 1516   BASOSABS 0.0 03/09/2017 1516     Chest imaging: 2017 V/Q scan [facility]: low prob PE  PFT: 2017 PFT [facility]: Ratio 83%, FVC 4.03 L 114% predicted, total lung capacity 5.5 L 99% predicted, DLCO 29.14 144% predict  Labs: August 2018 ANA [phone], double-stranded, Smith, RNP, SSA, SSB, SCL 70, Jo 1 all negative.  Anticentromere positive, rheumatoid factor positive, CCP was negative.  Anticardiolipin, beta-2 were both negative.  Path:  Echo: \03/2017 [facility]TE [facility] LVEF 66%, no valvular abnormalities, right atrial pressure, inadequate tricuspid regurg jet to est PA pressure  Heart Catheterization:       Assessment & Plan:   Pulmonary hypertension, low resistance (HCC) - Plan: Pulmonary function test  Limited systemic sclerosis (HCC)  Discussion: Paula [name] has a limited systemic sclerosis presentation who has recently changed her care to a local rheumatologist.  She has been referred to me for evaluation in the  setting to ensure there is no evidence of pulmonary hypertension.  Fortunately she has no respiratory symptoms right now.  Her work-up by [facility] in 2017 show evidence of pulmonary arteriopathy.  Next line Today we spent a significant amount of time talking about the fact that she needs to let us know if she develops shortness of breath call swelling, or presyncope as these can be eaters of pulmonary hypertension.  Plan: Limited systemic sclerosis with risk for developing pulmonary hypertension: Continue follow-up with rheumatology We will check a lung function test now and again in 1 year Ask for Dr. Jacinto HalimGanji to forward the results of his echocardiograms  Please let me know if you develop shortness of breath, lightheadedness, or develop swelling.  We will see you back in one year   Current Outpatient Medications:  .  Ascorbic Acid (VITAMIN C) 1000 MG tablet, Take 1,000 mg by mouth daily., Disp: , Rfl:  .  Calcium Carbonate (CALCIUM 600 PO), Take 600 mg by mouth daily., Disp: , Rfl:  .  Cholecalciferol (VITAMIN D PO), Take 7,500 mg by mouth daily., Disp: , Rfl:  .  CLINDAMYCIN PHOSPHATE IV, Inject 800 mg into the vein every 30 (thirty) days., Disp: , Rfl:  .  fluconazole (DIFLUCAN) 100 MG tablet, every other day., Disp: , Rfl: 5 .  levothyroxine (SYNTHROID) 88 MCG tablet, Take 1 tablet by mouth daily. 6 days a week, Disp: , Rfl:  .  liothyronine (CYTOMEL) 25 MCG tablet, Take 37.5 mcg by mouth daily. , Disp: , Rfl:  .  Magnesium 300 MG CAPS, Take by mouth daily., Disp: , Rfl:  .  Menaquinone-7 (VITAMIN K2 PO), Take 500 mcg by mouth daily., Disp: , Rfl:  .  minocycline (MINOCIN,DYNACIN) 100 MG capsule, 200 mg  every other day., Disp: , Rfl: 5 .  NALTREXONE HCL PO, Take 4.5 mg by mouth daily., Disp: , Rfl:  .  OVER THE COUNTER MEDICATION, , Disp: , Rfl:  .  OVER THE COUNTER MEDICATION, 12.5 mg daily., Disp: , Rfl:  .  OVER THE COUNTER MEDICATION, , Disp: , Rfl:  .  Probiotic Product (PROBIOTIC  PO), Take by mouth daily., Disp: , Rfl:  .  progesterone (PROMETRIUM) 100 MG capsule, , Disp: , Rfl:  .  progesterone (PROMETRIUM) 200 MG capsule, TK ONE C PO NIGHTLY B BED, with estrogen and testosterone, Disp: , Rfl: 3 .  vitamin E 400 UNIT capsule, Take 800 Units by mouth daily., Disp: , Rfl:   Current Facility-Administered Medications:  .  0.9 %  sodium chloride infusion, 500 mL, Intravenous, Continuous, Meryl Dare, MD

## 2017-12-27 NOTE — Patient Instructions (Signed)
Limited systemic sclerosis with risk for developing pulmonary hypertension: Continue follow-up with rheumatology We will check a lung function test now and again in 1 year Ask for Dr. Jacinto HalimGanji to forward the results of his echocardiograms  Please let me know if you develop shortness of breath, lightheadedness, or develop swelling.  We will see you back in one year

## 2018-01-05 ENCOUNTER — Ambulatory Visit (INDEPENDENT_AMBULATORY_CARE_PROVIDER_SITE_OTHER): Payer: 59 | Admitting: Pulmonary Disease

## 2018-01-05 DIAGNOSIS — I272 Pulmonary hypertension, unspecified: Secondary | ICD-10-CM | POA: Diagnosis not present

## 2018-01-05 LAB — PULMONARY FUNCTION TEST
DL/VA % pred: 111 %
DL/VA: 5.72 ml/min/mmHg/L
DLCO UNC % PRED: 115 %
DLCO UNC: 32.59 ml/min/mmHg
FEF 25-75 Pre: 4.68 L/sec
FEF2575-%Pred-Pre: 180 %
FEV1-%PRED-PRE: 113 %
FEV1-Pre: 3.29 L
FEV1FVC-%PRED-PRE: 112 %
FEV6-%Pred-Pre: 104 %
FEV6-Pre: 3.75 L
FEV6FVC-%Pred-Pre: 103 %
FVC-%Pred-Pre: 100 %
FVC-Pre: 3.75 L
PRE FEV1/FVC RATIO: 88 %
PRE FEV6/FVC RATIO: 100 %
RV % pred: 121 %
RV: 2.56 L
TLC % PRED: 120 %
TLC: 6.62 L

## 2018-01-05 NOTE — Progress Notes (Signed)
PFT completed 01/05/18  

## 2018-01-10 ENCOUNTER — Other Ambulatory Visit: Payer: Self-pay | Admitting: Emergency Medicine

## 2018-01-10 DIAGNOSIS — E041 Nontoxic single thyroid nodule: Secondary | ICD-10-CM

## 2018-02-02 ENCOUNTER — Ambulatory Visit
Admission: RE | Admit: 2018-02-02 | Discharge: 2018-02-02 | Disposition: A | Discharge status: Home / Self Care | Payer: 59 | Source: Ambulatory Visit | Attending: Emergency Medicine | Admitting: Emergency Medicine

## 2018-02-02 DIAGNOSIS — E041 Nontoxic single thyroid nodule: Secondary | ICD-10-CM

## 2018-03-22 NOTE — Progress Notes (Signed)
Office Visit Note  Patient: Paula [name]             Date of Birth: [birth year]           MRN: [id number]             PCP: Jones Bales, MD Referring: Jones Bales, MD Visit Date: 04/05/2018 Occupation: @GUAROCC @  Subjective:  Increased muscle pain and weakness.   History of Present Illness: Paula [name] is a 57 y.o. female with history of limited systemic sclerosis and osteoarthritis.  She states she has been experiencing increased muscle pain and weakness in her upper and lower extremities.  She denies any difficulty getting up from the chair and no difficulty raising her arms.  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  She has not noticed any worsening of Raynauds as the weather has been warmer.  Activities of Daily Living:  Patient reports morning stiffness for a couple of hours.   Patient Reports nocturnal pain.  Difficulty dressing/grooming: Denies Difficulty climbing stairs: Reports Difficulty getting out of chair: Denies Difficulty using hands for taps, buttons, cutlery, and/or writing: Reports  Review of Systems  Constitutional: Positive for fatigue. Negative for night sweats, weight gain and weight loss.  HENT: Negative for mouth sores, trouble swallowing, trouble swallowing, mouth dryness and nose dryness.   Eyes: Negative for pain, redness, visual disturbance and dryness.  Respiratory: Negative for cough, shortness of breath, wheezing and difficulty breathing.   Cardiovascular: Negative for chest pain, palpitations, hypertension, irregular heartbeat and swelling in legs/feet.  Gastrointestinal: Negative for abdominal pain, blood in stool, constipation, diarrhea, nausea and vomiting.  Endocrine: Negative for increased urination.  Genitourinary: Negative for nocturia, pelvic pain and vaginal dryness.  Musculoskeletal: Positive for arthralgias, joint pain, muscle weakness, morning stiffness and muscle tenderness. Negative for joint swelling, myalgias and  myalgias.  Skin: Negative for color change, rash, hair loss, skin tightness, ulcers and sensitivity to sunlight.  Allergic/Immunologic: Negative for susceptible to infections.  Neurological: Negative for dizziness, headaches, memory loss, night sweats and weakness.  Hematological: Negative for bruising/bleeding tendency and swollen glands.  Psychiatric/Behavioral: Negative for depressed mood, confusion and sleep disturbance. The patient is not nervous/anxious.     PMFS History:  Patient Active Problem List   Diagnosis Date Noted  . Limited systemic sclerosis (HCC) 11/07/2017  . Primary osteoarthritis of both hands 11/07/2017  . Chondromalacia of patella 11/07/2017  . Primary osteoarthritis of both feet 11/07/2017  . History of Epstein-Barr virus infection 11/07/2017  . History of diverticulosis 11/07/2017  . History of mycoplasma pneumonia 11/07/2017  . History of hypothyroidism 11/07/2017  . Hallux rigidus of right foot 05/03/2017  . Pain in joint, ankle and foot 04/17/2014  . Abnormality of gait 04/17/2014  . Elevated parathyroid hormone 12/18/2013  . Thyroid disease     Past Medical History:  Diagnosis Date  . Anxiety   . Arthritis   . CREST syndrome (HCC)   . Diverticulosis   . Gastric ulcer   . Hypercholesterolemia   . Lyme disease   . Thyroid disease   . Vitamin B deficiency     Family History  Problem Relation Age of Onset  . Diabetes Sister   . Healthy Daughter   . Healthy Son   . Colon cancer Neg Hx   . Pancreatic cancer Neg Hx   . Rectal cancer Neg Hx   . Stomach cancer Neg Hx    Past Surgical History:  Procedure Laterality Date  . KNEE ARTHROPLASTY    .  KNEE SURGERY  2007  . OOPHORECTOMY  2004  . SHOULDER SURGERY  03/2012  . TOTAL SHOULDER ARTHROPLASTY     Social History   Social History Narrative   ** Merged History Encounter **        Objective: Vital Signs: BP (!) 149/81 (BP Location: Left Arm, Patient Position: Sitting, Cuff Size: Normal)    Pulse 77   Resp 13   Ht 5\' 7"  (1.702 m)   Wt 208 lb (94.3 kg)   BMI 32.58 kg/m    Physical Exam  Constitutional: She is oriented to person, place, and time. She appears well-developed and well-nourished.  HENT:  Head: Normocephalic and atraumatic.  Eyes: Conjunctivae and EOM are normal.  Neck: Normal range of motion.  Cardiovascular: Normal rate, regular rhythm, normal heart sounds and intact distal pulses.  Pulmonary/Chest: Effort normal and breath sounds normal.  Abdominal: Soft. Bowel sounds are normal.  Lymphadenopathy:    She has no cervical adenopathy.  Neurological: She is alert and oriented to person, place, and time.  Skin: Skin is warm and dry. Capillary refill takes less than 2 seconds.  Sclerodactyly, telangiectesia, nail fold capillary changes  Psychiatric: She has a normal mood and affect. Her behavior is normal.  Nursing note and vitals reviewed.    Musculoskeletal Exam: She has limited range of motion of her cervical spine.  She has discomfort range of motion of lumbar spine.  Shoulder joints, elbow joints, wrist joints, MCPs were in good range of motion.  She has thickening of PIP DIP joints with an incomplete fist formation bilaterally.  No synovitis was noted.  Hip joints, knee joints, ankles MTPs PIPs were in good range of motion with no synovitis.  CDAI Exam: CDAI Score: Not documented Patient Global Assessment: Not documented; Provider Global Assessment: Not documented Swollen: Not documented; Tender: Not documented Joint Exam   Not documented   There is currently no information documented on the homunculus. Go to the Rheumatology activity and complete the homunculus joint exam.  Investigation: No additional findings.  Imaging: No results found.  Recent Labs: Lab Results  Component Value Date   WBC 6.4 03/09/2017   HGB 14.8 03/09/2017   PLT 244.0 03/09/2017   NA 139 03/14/2017   K 4.6 03/14/2017   CL 106 03/14/2017   CO2 29 03/14/2017    GLUCOSE 99 03/14/2017   BUN 17 03/14/2017   CREATININE 0.80 03/14/2017   BILITOT 0.4 03/09/2017   ALKPHOS 70 03/09/2017   AST 17 03/09/2017   ALT 20 03/09/2017   PROT 7.1 03/09/2017   ALBUMIN 4.2 03/09/2017   CALCIUM 10.2 03/14/2017    Speciality Comments: No specialty comments available.  Procedures:  No procedures performed Allergies: Celebrex [celecoxib] and Vioxx [rofecoxib]   Assessment / Plan:     Visit Diagnoses: Limited systemic sclerosis (HCC) - sclerodactyly, nail fold capillary changes, raynaud's phenominon, fatigue, few telangiectasias, ANA+, anticentromere+, RF+ - Plan: Urinalysis, Routine w reflex microscopic  Other fatigue -patient has been experiencing increased fatigue.  Plan: CBC with Differential/Platelet, COMPLETE METABOLIC PANEL WITH GFR, TSH, Sedimentation rate, VITAMIN D 25 Hydroxy (Vit-D Deficiency, Fractures), Serum protein electrophoresis with reflex  Myalgia -she complains of increased muscle weakness and muscle pain.  Plan: CK.  I will contact her after the lab work.  Raynaud's disease without gangrene - Improved  on Amlodipine.  It is not currently very active due to warmer weather.  DDD (degenerative disc disease), cervical-she has limited range of motion of her cervical spine.  DDD (degenerative disc disease), lumbar-she has chronic lower back pain.  Primary osteoarthritis of both hands-she has severe osteoarthritis in her hands with decrease fist formation.  Joint protection was discussed.  Primary osteoarthritis of both feet-she has some discomfort in her feet.  Chondromalacia of patella, unspecified laterality-she has chronic knee joint discomfort.  She had arthroscopic surgery in the past.  History of diverticulosis  History of hypothyroidism   Orders: Orders Placed This Encounter  Procedures  . CBC with Differential/Platelet  . COMPLETE METABOLIC PANEL WITH GFR  . Urinalysis, Routine w reflex microscopic  . CK  . TSH  .  Sedimentation rate  . VITAMIN D 25 Hydroxy (Vit-D Deficiency, Fractures)  . Serum protein electrophoresis with reflex   No orders of the defined types were placed in this encounter.   Face-to-face time spent with patient was 30 minutes. Greater than 50% of time was spent in counseling and coordination of care.  Follow-Up Instructions: Return in about 6 months (around 10/04/2018) for Scleroderma, Osteoarthritis, myalgia.   Pollyann Savoy, MD  Note - This record has been created using Animal nutritionist.  Chart creation errors have been sought, but may not always  have been located. Such creation errors do not reflect on  the standard of medical care.

## 2018-04-05 ENCOUNTER — Ambulatory Visit (INDEPENDENT_AMBULATORY_CARE_PROVIDER_SITE_OTHER): Payer: 59 | Admitting: Rheumatology

## 2018-04-05 ENCOUNTER — Encounter: Payer: Self-pay | Admitting: Rheumatology

## 2018-04-05 VITALS — BP 149/81 | HR 77 | Resp 13 | Ht 67.0 in | Wt 208.0 lb

## 2018-04-05 DIAGNOSIS — M349 Systemic sclerosis, unspecified: Secondary | ICD-10-CM

## 2018-04-05 DIAGNOSIS — Z8719 Personal history of other diseases of the digestive system: Secondary | ICD-10-CM

## 2018-04-05 DIAGNOSIS — R5383 Other fatigue: Secondary | ICD-10-CM | POA: Diagnosis not present

## 2018-04-05 DIAGNOSIS — M19042 Primary osteoarthritis, left hand: Secondary | ICD-10-CM

## 2018-04-05 DIAGNOSIS — M19041 Primary osteoarthritis, right hand: Secondary | ICD-10-CM

## 2018-04-05 DIAGNOSIS — M5136 Other intervertebral disc degeneration, lumbar region: Secondary | ICD-10-CM

## 2018-04-05 DIAGNOSIS — I7300 Raynaud's syndrome without gangrene: Secondary | ICD-10-CM | POA: Diagnosis not present

## 2018-04-05 DIAGNOSIS — M5030 Other cervical disc degeneration, unspecified cervical region: Secondary | ICD-10-CM

## 2018-04-05 DIAGNOSIS — M791 Myalgia, unspecified site: Secondary | ICD-10-CM | POA: Diagnosis not present

## 2018-04-05 DIAGNOSIS — M19071 Primary osteoarthritis, right ankle and foot: Secondary | ICD-10-CM

## 2018-04-05 DIAGNOSIS — Z8639 Personal history of other endocrine, nutritional and metabolic disease: Secondary | ICD-10-CM

## 2018-04-05 DIAGNOSIS — M2240 Chondromalacia patellae, unspecified knee: Secondary | ICD-10-CM

## 2018-04-05 DIAGNOSIS — M19072 Primary osteoarthritis, left ankle and foot: Secondary | ICD-10-CM

## 2018-04-07 NOTE — Progress Notes (Signed)
Please advise her to decrease her calcium supplement due to elevated calcium.

## 2018-04-10 ENCOUNTER — Encounter: Payer: Self-pay | Admitting: *Deleted

## 2018-04-10 LAB — URINALYSIS, ROUTINE W REFLEX MICROSCOPIC
BILIRUBIN URINE: NEGATIVE
GLUCOSE, UA: NEGATIVE
Hgb urine dipstick: NEGATIVE
Ketones, ur: NEGATIVE
LEUKOCYTES UA: NEGATIVE
NITRITE: NEGATIVE
Protein, ur: NEGATIVE
Specific Gravity, Urine: 1.017 (ref 1.001–1.03)

## 2018-04-10 LAB — COMPLETE METABOLIC PANEL WITH GFR
AG RATIO: 1.8 (calc) (ref 1.0–2.5)
ALKALINE PHOSPHATASE (APISO): 82 U/L (ref 33–130)
ALT: 23 U/L (ref 6–29)
AST: 17 U/L (ref 10–35)
Albumin: 4 g/dL (ref 3.6–5.1)
BILIRUBIN TOTAL: 0.4 mg/dL (ref 0.2–1.2)
BUN: 23 mg/dL (ref 7–25)
CHLORIDE: 109 mmol/L (ref 98–110)
CO2: 23 mmol/L (ref 20–32)
Calcium: 10.9 mg/dL — ABNORMAL HIGH (ref 8.6–10.4)
Creat: 0.78 mg/dL (ref 0.50–1.05)
GFR, Est African American: 97 mL/min/{1.73_m2} (ref >=60)
GFR, Est Non African American: 84 mL/min/{1.73_m2} (ref >=60)
GLUCOSE: 127 mg/dL — AB (ref 65–99)
Globulin: 2.2 g/dL (calc) (ref 1.9–3.7)
POTASSIUM: 5.3 mmol/L (ref 3.5–5.3)
Sodium: 141 mmol/L (ref 135–146)
Total Protein: 6.2 g/dL (ref 6.1–8.1)

## 2018-04-10 LAB — CBC WITH DIFFERENTIAL/PLATELET
BASOS ABS: 18 {cells}/uL (ref 0–200)
BASOS PCT: 0.4 %
EOS ABS: 162 {cells}/uL (ref 15–500)
Eosinophils Relative: 3.6 %
HCT: 40.9 % (ref 35.0–45.0)
Hemoglobin: 13.7 g/dL (ref 11.7–15.5)
Lymphs Abs: 869 cells/uL (ref 850–3900)
MCH: 30.3 pg (ref 27.0–33.0)
MCHC: 33.5 g/dL (ref 32.0–36.0)
MCV: 90.5 fL (ref 80.0–100.0)
MONOS PCT: 10.1 %
MPV: 11 fL (ref 7.5–12.5)
Neutro Abs: 2997 cells/uL (ref 1500–7800)
Neutrophils Relative %: 66.6 %
PLATELETS: 232 10*3/uL (ref 140–400)
RBC: 4.52 10*6/uL (ref 3.80–5.10)
RDW: 12.6 % (ref 11.0–15.0)
Total Lymphocyte: 19.3 %
WBC: 4.5 10*3/uL (ref 3.8–10.8)
WBCMIX: 455 {cells}/uL (ref 200–950)

## 2018-04-10 LAB — IFE INTERPRETATION: Immunofix Electr Int: NOT DETECTED

## 2018-04-10 LAB — PROTEIN ELECTROPHORESIS, SERUM, WITH REFLEX
ALBUMIN ELP: 4 g/dL (ref 3.8–4.8)
ALPHA 2: 0.6 g/dL (ref 0.5–0.9)
Alpha 1: 0.3 g/dL (ref 0.2–0.3)
BETA GLOBULIN: 0.4 g/dL (ref 0.4–0.6)
Beta 2: 0.2 g/dL (ref 0.2–0.5)
Gamma Globulin: 0.7 g/dL — ABNORMAL LOW (ref 0.8–1.7)
TOTAL PROTEIN: 6.2 g/dL (ref 6.1–8.1)

## 2018-04-10 LAB — VITAMIN D 25 HYDROXY (VIT D DEFICIENCY, FRACTURES): Vit D, 25-Hydroxy: 61 ng/mL (ref 30–100)

## 2018-04-10 LAB — SEDIMENTATION RATE: Sed Rate: 6 mm/h (ref 0–30)

## 2018-04-10 LAB — CK: CK TOTAL: 49 U/L (ref 29–143)

## 2018-04-10 LAB — TSH: TSH: 0.02 mIU/L — ABNORMAL LOW (ref 0.40–4.50)

## 2018-06-08 ENCOUNTER — Ambulatory Visit: Payer: 59 | Admitting: Rheumatology

## 2018-07-25 NOTE — Progress Notes (Signed)
Office Visit Note  Patient: Paula [name]             Date of Birth: 04/09/1961           MRN: [id number]             PCP: Jones BalesLantelme, Bruce E, MD Referring: Jones BalesLantelme, Bruce E, MD Visit Date: 08/01/2018 Occupation: @GUAROCC @  Subjective:  Raynaud's   History of Present Illness: Paula [name] is a 58 y.o. female with history of limited systemic sclerosis, Raynaud's, DDD, and osteoarthritis.  Patient presents today with worsening symptoms of Raynaud's.  She states that this past fall she developed severe ulcerations on her fingertips.  She states she has started using polysporin and the ulcers have started to heal.  She has also noticed some hyperpigmentations of the fingertips. She states that she continues to notice nail fold changes.  She states she used to be on amlodipine but developed constipation and discontinued after about 2 weeks.  She denies any rashes.  She denies any increased skin tightness.  She denies any dysphagia.  She is experiencing pain and stiffness in both hands.  She has intermittent joint swelling.  She continues to have chronic neck pain and stiffness.  She sees a [facility] on a regular basis.  She is planning on going Thursday due to experiencing increased stiffness. Her lower back has been doing better and she continues to ride horses on a regular basis.  Her level of fatigue has improved.     Activities of Daily Living:  Patient reports morning stiffness for 0 minutes.   Patient Denies nocturnal pain.  Difficulty dressing/grooming: Denies Difficulty climbing stairs: Reports Difficulty getting out of chair: Denies Difficulty using hands for taps, buttons, cutlery, and/or writing: Reports  Review of Systems  Constitutional: Negative for fatigue.  HENT: Negative for mouth sores, trouble swallowing, trouble swallowing, mouth dryness and nose dryness.   Eyes: Negative for pain, redness, itching, visual disturbance and dryness.  Respiratory: Negative for  cough, hemoptysis, shortness of breath, wheezing and difficulty breathing.   Cardiovascular: Negative for chest pain, palpitations, hypertension and swelling in legs/feet.  Gastrointestinal: Negative for abdominal pain, blood in stool, constipation and diarrhea.  Endocrine: Negative for increased urination.  Genitourinary: Negative for painful urination, nocturia and pelvic pain.  Musculoskeletal: Positive for arthralgias, joint pain and joint swelling. Negative for myalgias, muscle weakness, morning stiffness, muscle tenderness and myalgias.  Skin: Positive for color change. Negative for pallor, rash, hair loss, nodules/bumps, skin tightness, ulcers and sensitivity to sunlight.  Allergic/Immunologic: Negative for susceptible to infections.  Neurological: Negative for dizziness, light-headedness, numbness, headaches, Paula loss and weakness.  Hematological: Negative for bruising/bleeding tendency and swollen glands.  Psychiatric/Behavioral: Negative for depressed mood, confusion and sleep disturbance. The patient is not nervous/anxious.     PMFS History:  Patient Active Problem List   Diagnosis Date Noted  . Limited systemic sclerosis (HCC) 11/07/2017  . Primary osteoarthritis of both hands 11/07/2017  . Chondromalacia of patella 11/07/2017  . Primary osteoarthritis of both feet 11/07/2017  . History of Epstein-Barr virus infection 11/07/2017  . History of diverticulosis 11/07/2017  . History of mycoplasma pneumonia 11/07/2017  . History of hypothyroidism 11/07/2017  . Hallux rigidus of right foot 05/03/2017  . Pain in joint, ankle and foot 04/17/2014  . Abnormality of gait 04/17/2014  . Elevated parathyroid hormone 12/18/2013  . Thyroid disease     Past Medical History:  Diagnosis Date  . Anxiety   . Arthritis   .  CREST syndrome (HCC)   . Diverticulosis   . Gastric ulcer   . Hypercholesterolemia   . Lyme disease   . Thyroid disease   . Vitamin B deficiency     Family  History  Problem Relation Age of Onset  . Diabetes Sister   . Healthy Daughter   . Healthy Son   . Colon cancer Neg Hx   . Pancreatic cancer Neg Hx   . Rectal cancer Neg Hx   . Stomach cancer Neg Hx    Past Surgical History:  Procedure Laterality Date  . KNEE ARTHROPLASTY    . KNEE SURGERY  2007  . OOPHORECTOMY  2004  . SHOULDER SURGERY  03/2012  . TOTAL SHOULDER ARTHROPLASTY     Social History   Social History Narrative   ** Merged History Encounter **        Objective: Vital Signs: BP (!) 143/84 (BP Location: Left Arm, Patient Position: Sitting, Cuff Size: Normal)   Pulse 80   Resp 12   Ht 5\' 7"  (1.702 m)   Wt 201 lb 12.8 oz (91.5 kg)   BMI 31.61 kg/m    Physical Exam Vitals signs and nursing note reviewed.  Constitutional:      Appearance: She is well-developed.  HENT:     Head: Normocephalic and atraumatic.  Eyes:     Conjunctiva/sclera: Conjunctivae normal.  Neck:     Musculoskeletal: Normal range of motion.  Cardiovascular:     Rate and Rhythm: Normal rate and regular rhythm.     Heart sounds: Normal heart sounds.  Pulmonary:     Effort: Pulmonary effort is normal.     Breath sounds: Normal breath sounds.  Abdominal:     General: Bowel sounds are normal.     Palpations: Abdomen is soft.  Lymphadenopathy:     Cervical: No cervical adenopathy.  Skin:    General: Skin is warm and dry.     Capillary Refill: Capillary refill takes less than 2 seconds.     Comments: Healing ulcerations present on the right 2nd and 3rd fingertips.  Nailbed capillary changes noted.   Neurological:     Mental Status: She is alert and oriented to person, place, and time.  Psychiatric:        Behavior: Behavior normal.      Musculoskeletal Exam: C-spine limited ROM with lateral rotation.  Thoracic and lumbar spine good ROM.  No midline spinal tenderness.  No SI joint tenderness. Right sternoclavicular joint synovial thickening and mild tenderness.  Shoulder joints, elbow  joints, wrist joints, MCPs, PIPs, and DIPs good ROM with no synovitis.  PIP and DIP synovial thickening.  Hip joints, knee joints, ankle joints, MTPs, PIPs and DIPs good ROM with no synovitis.  No warmth or effusion of knee joints.  No tenderness or swelling of ankle joints.  No tenderness over trochanteric bursa bilaterally.    CDAI Exam: CDAI Score: Not documented Patient Global Assessment: Not documented; Provider Global Assessment: Not documented Swollen: Not documented; Tender: Not documented Joint Exam   Not documented   There is currently no information documented on the homunculus. Go to the Rheumatology activity and complete the homunculus joint exam.  Investigation: No additional findings.  Imaging: No results found.  Recent Labs: Lab Results  Component Value Date   WBC 4.5 04/05/2018   HGB 13.7 04/05/2018   PLT 232 04/05/2018   NA 141 04/05/2018   K 5.3 04/05/2018   CL 109 04/05/2018   CO2 23  04/05/2018   GLUCOSE 127 (H) 04/05/2018   BUN 23 04/05/2018   CREATININE 0.78 04/05/2018   BILITOT 0.4 04/05/2018   ALKPHOS 70 03/09/2017   AST 17 04/05/2018   ALT 23 04/05/2018   PROT 6.2 04/05/2018   PROT 6.2 04/05/2018   ALBUMIN 4.2 03/09/2017   CALCIUM 10.9 (H) 04/05/2018   GFRAA 97 04/05/2018    Speciality Comments: No specialty comments available.  Procedures:  No procedures performed Allergies: Celebrex [celecoxib] and Vioxx [rofecoxib]   Assessment / Plan:     Visit Diagnoses: Limited systemic sclerosis (HCC) - Sclerodactyly, nail fold capillary changes, raynaud's, fatigue, few telangiectasias, ANA+, anticentromere+, RF+: She presents today with worsening symptoms of Raynaud's.  She has 2 healing digital ulcerations on the right 2nd and 3rd digits.  No signs of gangrene.  Capillary refill 2-3 seconds.  She continues to have nail fold capillary changes and a few scattered telangiectasias. She has tried amlodipine in the past, but she discontinued due to  experiencing constipation.  She is willing to retry taking amlodipine at a low dose.  She will start on a 1/2 tablet of amlodipine 5 mg daily and then increase to one 5 mg tablet after 2 weeks.  She was advised to monitor her blood pressure closely. She will also start on nitroglycerin topically TID.  She can continue use topical polysporin on the healing digital ulcers.  She was advised to notify us if she develops new or worsening symptoms.    Myalgia: Improved.   Other fatigue: Improved.   Raynaud's disease without gangrene - She has been experiencing worsening symptoms of Raynaud's.  She has healing ulcerations on the right 2nd and 3rd fingertips.  She has tried amlodipine in the past but she developed constipation and discontinued after about 2 weeks.  She was advised to keep her core body temperature warm and wear gloves on a regular basis.  She will restart on Amlodipine 5 mg po daily. She will start taking 1/2 tablet for 2 weeks and monitor her blood pressure closely and then increase to 1 tablet daily. Side effects were reviewed.  She will also start on Nitroglycerin ointment topically TID.    Primary osteoarthritis of both hands: She has PIP and DIP synovial thickening consistent with osteoarthritis of both hands.  She has no synovitis on exam.  Joint protection and muscle strengthening were discussed.    Primary osteoarthritis of both feet: She has mild osteoarthritic changes in both feet. She has no discomfort in her feet at this time. She wears proper fitting shoes.   DDD (degenerative disc disease), cervical: She has limited ROM with lateral rotation.  She has no symptoms of radiculopathy at this time.  She sees a [facility] on a regular basis.  She is planning on going Thursday due to increased neck stiffness recently.   DDD (degenerative disc disease), lumbar: no midline spinal tenderness.  No SI joint tenderness.  She has good ROM with no discomfort. She continues to go horseback  riding on a regular basis.   Chondromalacia of patella, unspecified laterality - She had arthroscopic surgery in the past:  She has no warmth or effusion of knees joints.  She has good ROM.    Other medical conditions are listed as follows:   History of hypothyroidism  History of diverticulosis   Orders: No orders of the defined types were placed in this encounter.  Meds ordered this encounter  Medications  . amLODipine (NORVASC) 5 MG tablet  Sig: Take 1 tablet (5 mg total) by mouth daily.    Dispense:  30 tablet    Refill:  2  . nitroGLYCERIN (NITROGLYN) 2 % ointment    Sig: Apply 0.25 inches topically 3 (three) times daily.    Dispense:  30 g    Refill:  0    Face-to-face time spent with patient was 30 minutes. Greater than 50% of time was spent in counseling and coordination of care.  Follow-Up Instructions: Return in about 3 months (around 10/31/2018) for Limited systemic sclerosis, Raynaud's syndrome, Osteoarthritis, DDD.   Sherron Ales PA-C  I examined and evaluated the patient with Sherron Ales PA.  I did not notice any progression of his sclerodactyly on examination.  She has some nailbed capillary changes and raynauds phenomenon.  She has healing ulcer over her right second and third distal phalanx.  Use of nitroglycerin and amlodipine was discussed.  Side effects of both medications were discussed.  She will start on low-dose amlodipine and increase as tolerated.  Plan of care was discussed as noted above.  Pollyann Savoy, MD  Note - This record has been created using Animal nutritionist.  Chart creation errors have been sought, but may not always  have been located. Such creation errors do not reflect on  the standard of medical care.

## 2018-08-01 ENCOUNTER — Encounter: Payer: Self-pay | Admitting: Rheumatology

## 2018-08-01 ENCOUNTER — Ambulatory Visit (INDEPENDENT_AMBULATORY_CARE_PROVIDER_SITE_OTHER): Payer: 59 | Admitting: Rheumatology

## 2018-08-01 VITALS — BP 143/84 | HR 80 | Resp 12 | Ht 67.0 in | Wt 201.8 lb

## 2018-08-01 DIAGNOSIS — Z8719 Personal history of other diseases of the digestive system: Secondary | ICD-10-CM

## 2018-08-01 DIAGNOSIS — M19041 Primary osteoarthritis, right hand: Secondary | ICD-10-CM

## 2018-08-01 DIAGNOSIS — R5383 Other fatigue: Secondary | ICD-10-CM | POA: Diagnosis not present

## 2018-08-01 DIAGNOSIS — M2240 Chondromalacia patellae, unspecified knee: Secondary | ICD-10-CM

## 2018-08-01 DIAGNOSIS — M791 Myalgia, unspecified site: Secondary | ICD-10-CM | POA: Diagnosis not present

## 2018-08-01 DIAGNOSIS — M349 Systemic sclerosis, unspecified: Secondary | ICD-10-CM

## 2018-08-01 DIAGNOSIS — M19072 Primary osteoarthritis, left ankle and foot: Secondary | ICD-10-CM

## 2018-08-01 DIAGNOSIS — I7300 Raynaud's syndrome without gangrene: Secondary | ICD-10-CM | POA: Diagnosis not present

## 2018-08-01 DIAGNOSIS — Z8639 Personal history of other endocrine, nutritional and metabolic disease: Secondary | ICD-10-CM

## 2018-08-01 DIAGNOSIS — M51369 Other intervertebral disc degeneration, lumbar region without mention of lumbar back pain or lower extremity pain: Secondary | ICD-10-CM

## 2018-08-01 DIAGNOSIS — M5030 Other cervical disc degeneration, unspecified cervical region: Secondary | ICD-10-CM

## 2018-08-01 DIAGNOSIS — M19071 Primary osteoarthritis, right ankle and foot: Secondary | ICD-10-CM

## 2018-08-01 DIAGNOSIS — M5136 Other intervertebral disc degeneration, lumbar region: Secondary | ICD-10-CM

## 2018-08-01 DIAGNOSIS — M19042 Primary osteoarthritis, left hand: Secondary | ICD-10-CM

## 2018-08-01 MED ORDER — NITROGLYCERIN 2 % TD OINT: 0.2500 [in_us] | TOPICAL_OINTMENT | Freq: Three times a day (TID) | TRANSDERMAL | 0 refills | Status: DC

## 2018-08-01 MED ORDER — AMLODIPINE BESYLATE 5 MG PO TABS: 5.0000 mg | ORAL_TABLET | Freq: Every day | ORAL | 2 refills | Status: DC

## 2018-08-01 NOTE — Patient Instructions (Signed)
For first dose of amlodipine take 1/2 tablet.

## 2018-10-05 ENCOUNTER — Ambulatory Visit: Payer: 59 | Admitting: Physician Assistant

## 2018-10-24 NOTE — Progress Notes (Signed)
Virtual Visit via Telephone Note  I connected with Paula Paula [name] on 10/24/18 at 10:15 AM EDT by telephone and verified that I am speaking with the correct person using two identifiers.   I discussed the limitations, risks, security and privacy concerns of performing an evaluation and management service by telephone and the availability of in person appointments. I also discussed with the Paula [name] that there may be a Paula [name] responsible charge related to this service. The Paula [name] expressed understanding and agreed to proceed.  CC: Paula Paula [name]   History of Present Illness: Paula [name] is a 58 year old female with a past medical history of limited systemic sclerosis, Paula Paula [name], osteoarthritis, and DDD.   She is having more severe symptoms of Paula Paula [name] in the left ring finger.  She has been using nitroglycerin ointment topically. She denies any digital ulcerations or signs of gangrene.  She is taking a supplement that increases your nitric oxide naturally, which she states has been more effective than Norvasc.  She states norvasc caused constipation so she discontinued. She denies any increased skin tightness or thickening.  She denies any recent rashes.  She denies any worsening fatigue.  Her myalgias have improved.  She continues to have intermittent discomfort in both hands and both feet.  She tries to avoid inflammatory type foods.  She  Has occasional neck and lower back pain.  She had facet joint injections about 1 month ago, which provided some relief.  She checks her BP at home on a regular basis.  Her BP has been well controlled.    Review of Systems  Constitutional: Negative for fever and malaise/fatigue.  Eyes: Negative for photophobia, pain, discharge and redness.  Respiratory: Negative for cough, shortness of breath and wheezing.   Cardiovascular: Negative for chest pain and palpitations.  Gastrointestinal: Negative for blood in stool, constipation and diarrhea.  Genitourinary: Negative for  dysuria.  Musculoskeletal: Positive for back pain, joint pain and neck pain. Negative for myalgias.  Skin: Negative for rash.       +Paula Paula [name]  Neurological: Negative for dizziness and headaches.  Psychiatric/Behavioral: Negative for depression. The Paula [name] is not nervous/anxious and does not have insomnia.    Observations/Objective:  Physical Exam  Constitutional: She is oriented to person, place, and time.  Neurological: She is alert and oriented to person, place, and time.  Psychiatric: Mood, memory, affect and judgment normal.   Paula [name] reports morning stiffness for0  minutes.   Paula [name] denies nocturnal pain.  Difficulty dressing/grooming: Denies Difficulty climbing stairs: Denies Difficulty getting out of chair: Denies Difficulty using hands for taps, buttons, cutlery, and/or writing: Denies  Assessment and Plan: Visit Diagnoses: Limited systemic sclerosis (HCC) - Sclerodactyly, nail fold capillary changes, Paula Paula [name], fatigue, few telangiectasias, ANA+, anticentromere+, RF+: She continues to have intermittent symptoms of Paula Paula [name].  She is currently having worsening symptoms in the left ring finger.  She has no digital ulcerations or signs of gangrene.  She was encouraged to continue to use nitroglycerin ointment topically.  She discontinued Norvasc due to not tolerating the constipation she was experiencing.  She has not noticed any skin thickening or tightness.  She has no dysphagia at this time.  She has not had any recent rashes.  She was advised to notify us if she develops any new or worsening symptoms. She continues to follow up with Dr. Kendrick Fries on a yearly basis, and she was reminded to schedule a routine appointment in June 2020.  We will refer her to Dr. Jacinto Halim to monitor for pulmonary hypertension.  She will follow up in 5 months.  Myalgia: Improved.   Other fatigue: Her level of fatigue has improved. She has been walking for exercise.   Paula Paula [name] disease without gangrene  - She continues to have symptoms of Paula Paula [name].  She is having worsening symptoms in the left ring finger currently.  She has no digital ulcerations or signs of gangrene.  She has been using nitroglycerin ointment topically.  She discontinued taking Norvasc due to experiencing constipation.  She has been seeing an integrative medicine physician who recommended taking amino acids that increase her levels of nitric oxide naturally, which has been improving her symptoms.  She was encouraged to keep her core body temperature warm, wear gloves and socks, and drink warm liquids. She was advised to notify us if she develops ulcerations or signs of gangrene.   Primary osteoarthritis of both hands: She has intermittent pain in both hands but no joint swelling.   Primary osteoarthritis of both feet:  She has occasional discomfort.  She wears proper fitting shoes.  She has been walking for exercise.   DDD (degenerative disc disease), cervical:  She has intermittent discomfort and stiffness in her neck.  DDD (degenerative disc disease), lumbar: She had facet joint injections about 1 month ago, which have improved her lower back pain.   Chondromalacia of patella, unspecified laterality - She had arthroscopic surgery in the past:    Follow Up Instructions: She will follow up in 5 months.   She will have lab work forwarded to us from PCP. A referral to Dr. Jacinto HalimGanji will be placed.    I discussed the assessment and treatment plan with the Paula [name]. The Paula [name] was provided an opportunity to ask questions and all were answered. The Paula [name] agreed with the plan and demonstrated an understanding of the instructions.   The Paula [name] was advised to call back or seek an in-person evaluation if the symptoms worsen or if the condition fails to improve as anticipated.  I provided 25 minutes of non-face-to-face time during this encounter.  Paula SavoyShaili Deveshwar, MD   Scribed by-   Paula Alesaylor Dale, PA-C

## 2018-11-02 ENCOUNTER — Encounter: Payer: Self-pay | Admitting: Rheumatology

## 2018-11-02 ENCOUNTER — Telehealth (INDEPENDENT_AMBULATORY_CARE_PROVIDER_SITE_OTHER): Payer: 59 | Admitting: Rheumatology

## 2018-11-02 ENCOUNTER — Telehealth: Payer: Self-pay | Admitting: *Deleted

## 2018-11-02 ENCOUNTER — Other Ambulatory Visit: Payer: Self-pay

## 2018-11-02 DIAGNOSIS — M349 Systemic sclerosis, unspecified: Secondary | ICD-10-CM

## 2018-11-02 DIAGNOSIS — M5030 Other cervical disc degeneration, unspecified cervical region: Secondary | ICD-10-CM

## 2018-11-02 DIAGNOSIS — I7300 Raynaud's syndrome without gangrene: Secondary | ICD-10-CM

## 2018-11-02 DIAGNOSIS — Z8639 Personal history of other endocrine, nutritional and metabolic disease: Secondary | ICD-10-CM

## 2018-11-02 DIAGNOSIS — M791 Myalgia, unspecified site: Secondary | ICD-10-CM

## 2018-11-02 DIAGNOSIS — M19041 Primary osteoarthritis, right hand: Secondary | ICD-10-CM

## 2018-11-02 DIAGNOSIS — M51369 Other intervertebral disc degeneration, lumbar region without mention of lumbar back pain or lower extremity pain: Secondary | ICD-10-CM

## 2018-11-02 DIAGNOSIS — M19042 Primary osteoarthritis, left hand: Secondary | ICD-10-CM

## 2018-11-02 DIAGNOSIS — Z8719 Personal history of other diseases of the digestive system: Secondary | ICD-10-CM

## 2018-11-02 DIAGNOSIS — M19071 Primary osteoarthritis, right ankle and foot: Secondary | ICD-10-CM

## 2018-11-02 DIAGNOSIS — M19072 Primary osteoarthritis, left ankle and foot: Secondary | ICD-10-CM

## 2018-11-02 DIAGNOSIS — M2240 Chondromalacia patellae, unspecified knee: Secondary | ICD-10-CM

## 2018-11-02 DIAGNOSIS — M5136 Other intervertebral disc degeneration, lumbar region: Secondary | ICD-10-CM

## 2018-11-02 DIAGNOSIS — R5383 Other fatigue: Secondary | ICD-10-CM

## 2018-11-02 NOTE — Telephone Encounter (Signed)
Referral placed.

## 2018-11-03 ENCOUNTER — Telehealth: Payer: Self-pay | Admitting: Rheumatology

## 2018-11-03 NOTE — Telephone Encounter (Signed)
I LMOM for patient to call, and schedule a 5 month rov with Sherron Ales, PAC.

## 2018-11-03 NOTE — Telephone Encounter (Signed)
-----   Message from Henriette Combs, LPN sent at 6/44/0347  1:56 PM EDT ----- Please schedule patient for follow up visit in 5 months. Patient was seen for a telemedicine visit today 11/02/18. Thanks!

## 2018-11-17 ENCOUNTER — Telehealth: Payer: Self-pay | Admitting: *Deleted

## 2018-11-17 NOTE — Telephone Encounter (Signed)
Patient called demanding ECHO to be ordered at Dr. Verl Dicker office. Please advise.  Patient and Meriam Sprague from Dr. Verl Dicker office called. Meriam Sprague [phone].

## 2018-11-20 NOTE — Telephone Encounter (Signed)
I spoke with patient. Patient now has Paula [name]. I did get all pertinent information from patient. [employer] has patient's name as her nickname, Paula [name]. I advised patient to contact [employer] to get this changed so claims will not be denied. I gave info to [place] to contact insurance in regards to scheduling patient an ECHO.

## 2018-11-20 NOTE — Telephone Encounter (Signed)
Please call patient to get new insurance information and send message back to me when complete. Thank you.

## 2018-11-20 NOTE — Telephone Encounter (Signed)
LMOM for patient to call office regarding ECHO

## 2018-11-20 NOTE — Telephone Encounter (Signed)
Please, schedule ECHO at Dr. Verl Dicker office to r/o pulmonary HTN as pt has scleroderma.

## 2018-11-24 NOTE — Progress Notes (Signed)
Office Visit Note  Patient: Paula [name]             Date of Birth: 03/28/1961           MRN: [id number]             PCP: Jones Bales, MD Referring: Jones Bales, MD Visit Date: 11/27/2018 Occupation: @GUAROCC @  Subjective:  Digital ulceration  History of Present Illness: Paula [name] is a 58 y.o. female with past medical history of limited systemic sclerosis, Raynaud's, DDD, and osteoarthritis.  She presents today with a digital ulceration on the left fourth digit.  She states that she noticed the ulceration starting about 1 month ago.  She was applying Polysporin for the past 3 weeks.  She reports that she was seen by a provider in [facility] who prescribed Keflex 300 mg 3 times daily for 1 week.  She denies any recent fevers.  She has not completed the course of antibiotics yet.  She continues to use nitroglycerin ointment once daily.  She restarted taking Norvasc 5 mg half tablet by mouth daily for the past 2 days.  She plans on taking a full dose of Norvasc if her blood pressure remains well controlled. She is experiencing numbness in bilateral 4th digit fingertips.  She denies any other digital ulcerations or symptoms in her feet.  She denies any increased skin tightness.   She continues to have intermittent pain in both hands but no joint swelling.  She has intermittent neck and lower back pain.  She has no been riding her horse as frequently.     Activities of Daily Living:  Patient reports morning stiffness for 1 hour.   Patient Denies nocturnal pain.  Difficulty dressing/grooming: Denies Difficulty climbing stairs: Reports Difficulty getting out of chair: Denies Difficulty using hands for taps, buttons, cutlery, and/or writing: Reports  Review of Systems  Constitutional: Positive for fatigue.  HENT: Negative for mouth sores, mouth dryness and nose dryness.   Eyes: Positive for redness. Negative for pain, itching, visual disturbance and dryness.  Respiratory:  Negative for cough, hemoptysis, shortness of breath, wheezing and difficulty breathing.   Cardiovascular: Negative for chest pain, palpitations, hypertension and swelling in legs/feet.  Gastrointestinal: Negative for abdominal pain, blood in stool, constipation and diarrhea.  Endocrine: Negative for increased urination.  Genitourinary: Negative for painful urination and pelvic pain.  Musculoskeletal: Positive for arthralgias, joint pain, joint swelling and morning stiffness. Negative for myalgias, muscle weakness, muscle tenderness and myalgias.  Skin: Negative for color change, pallor, rash, hair loss, nodules/bumps, redness, skin tightness, ulcers and sensitivity to sunlight.  Allergic/Immunologic: Negative for susceptible to infections.  Neurological: Positive for weakness. Negative for dizziness, numbness, headaches and memory loss.  Hematological: Negative for bruising/bleeding tendency and swollen glands.  Psychiatric/Behavioral: Negative for depressed mood, confusion and sleep disturbance. The patient is not nervous/anxious.     PMFS History:  Patient Active Problem List   Diagnosis Date Noted  . Limited systemic sclerosis (HCC) 11/07/2017  . Primary osteoarthritis of both hands 11/07/2017  . Chondromalacia of patella 11/07/2017  . Primary osteoarthritis of both feet 11/07/2017  . History of Epstein-Barr virus infection 11/07/2017  . History of diverticulosis 11/07/2017  . History of mycoplasma pneumonia 11/07/2017  . History of hypothyroidism 11/07/2017  . Hallux rigidus of right foot 05/03/2017  . Pain in joint, ankle and foot 04/17/2014  . Abnormality of gait 04/17/2014  . Elevated parathyroid hormone 12/18/2013  . Thyroid disease  Past Medical History:  Diagnosis Date  . Anxiety   . Arthritis   . CREST syndrome (HCC)   . Diverticulosis   . Gastric ulcer   . Hypercholesterolemia   . Lyme disease   . Thyroid disease   . Vitamin B deficiency     Family History   Problem Relation Age of Onset  . Heart attack Father   . Diabetes Sister   . Healthy Daughter   . Healthy Son   . Colon cancer Neg Hx   . Pancreatic cancer Neg Hx   . Rectal cancer Neg Hx   . Stomach cancer Neg Hx    Past Surgical History:  Procedure Laterality Date  . KNEE ARTHROPLASTY    . KNEE SURGERY  2007  . OOPHORECTOMY  2004  . SHOULDER SURGERY  03/2012  . TOTAL SHOULDER ARTHROPLASTY     Social History   Social History Narrative   ** Merged History Encounter **       Immunization History  Administered Date(s) Administered  . Influenza,inj,Quad PF,6+ Mos 06/24/2014  . Tdap 04/04/2016     Objective: Vital Signs: BP (!) 150/81 (BP Location: Left Arm, Patient Position: Sitting, Cuff Size: Normal)   Pulse 76   Resp 14   Ht 5\' 7"  (1.702 m)   Wt 209 lb 12.8 oz (95.2 kg)   BMI 32.86 kg/m    Physical Exam Vitals signs and nursing note reviewed.  Constitutional:      Appearance: She is well-developed.  HENT:     Head: Normocephalic and atraumatic.  Eyes:     Conjunctiva/sclera: Conjunctivae normal.  Neck:     Musculoskeletal: Normal range of motion.  Cardiovascular:     Rate and Rhythm: Normal rate and regular rhythm.     Heart sounds: Normal heart sounds.  Pulmonary:     Effort: Pulmonary effort is normal.     Breath sounds: Normal breath sounds.  Abdominal:     General: Bowel sounds are normal.     Palpations: Abdomen is soft.  Lymphadenopathy:     Cervical: No cervical adenopathy.  Skin:    General: Skin is warm and dry.     Capillary Refill: Capillary refill takes less than 2 seconds.  Neurological:     Mental Status: She is alert and oriented to person, place, and time.  Psychiatric:        Behavior: Behavior normal.      Musculoskeletal Exam: C-spine limited ROM with lateral rotation.  Thoracic and lumbar spine good ROM.  No midline spinal tenderness.  Right sternoclavicular synovial thickening. Shoulder joints, elbow joints, wrist joints,  MCPs, PIPs, and DIPs good ROM with no synovitis.  Right 2nd MCP joint synovial thickening.  Thickening of all PIP and DIP joints.  Digital ulceration on 4th digit. Hip joints, knee joints, ankle joints, MTPs, PIPs, and DIPs good ROM with no synovitis.  No warmth or effusion of knee joints.  No tenderness or swelling of ankle joints.  No tenderness over trochanteric bursa bilaterally.   CDAI Exam: CDAI Score: Not documented Patient Global Assessment: Not documented; Provider Global Assessment: Not documented Swollen: Not documented; Tender: Not documented Joint Exam   Not documented   There is currently no information documented on the homunculus. Go to the Rheumatology activity and complete the homunculus joint exam.  Investigation: No additional findings.  Imaging: No results found.  Recent Labs: Lab Results  Component Value Date   WBC 4.5 04/05/2018   HGB 13.7 04/05/2018  PLT 232 04/05/2018   NA 141 04/05/2018   K 5.3 04/05/2018   CL 109 04/05/2018   CO2 23 04/05/2018   GLUCOSE 127 (H) 04/05/2018   BUN 23 04/05/2018   CREATININE 0.78 04/05/2018   BILITOT 0.4 04/05/2018   ALKPHOS 70 03/09/2017   AST 17 04/05/2018   ALT 23 04/05/2018   PROT 6.2 04/05/2018   PROT 6.2 04/05/2018   ALBUMIN 4.2 03/09/2017   CALCIUM 10.9 (H) 04/05/2018   GFRAA 97 04/05/2018    Speciality Comments: No specialty comments available.  Procedures:  No procedures performed Allergies: Celebrex [celecoxib] and Vioxx [rofecoxib]   Assessment / Plan:     Visit Diagnoses: Limited systemic sclerosis (HCC) - Sclerodactyly, nail fold capillary changes, raynaud's, fatigue, few telangiectasias, ANA+, anticentromere+, RF+: She presents today with a healing paronchyia on the left fourth digit.  She noticed the infection starting about 1 month ago and has been applying Polysporin and nitroglycerin ointment once daily.  She was advised to increase the frequency and use the nitroglycerin ointment TID. She  restarted taking Norvasc 2.5 mg yesterday and plans on increasing to 5 mg daily.  She was evaluated by a provider in [facility] who started her on Keflex 300 mg 3 times daily for 7 days.  She was advised to complete the course of Keflex.  She will follow up if the infection has not cleared after completing the antibiotics.  There are no signs of gangrene at this time.  Her capillary refill is 2 to 3 seconds.  She has nailfold capillary changes on examination.  Scattered Telangectasias were noted. She was advised to notify us if she develops any new or worsening symptoms.    Myalgia: She has no muscle aches or muscle tenderness at this time.  She has no muscle weakness.     Raynaud's disease without gangrene -She has had a paronychia on the left 4th digit for the past 1 month.  She has been applying Polysporin topically for the past 3 weeks.  She was evaluated by provider in [facility] who prescribed Keflex 300 mg 3 times daily for 7 days.  She was advised to complete the course of Keflex.  She restarted on Norvasc yesterday and has taken 2 doses.  She is prescribed Norvasc 5 mg but started taking 1/2 tablet and is going to increase to 5 mg in a few days.  She was advised to monitor her blood pressure closely.  She has been using nitrogylcerin ointment once daily, so she was advised to start using it TID.    Primary osteoarthritis of both hands: She has PIP and DIP synovial thickening consistent with osteoarthritis.  Bilateral CMC joint synovial thickening. Joint protection and muscle strengthening were discussed.   Primary osteoarthritis of both feet: She has no feet pain or joint swelling at this time.  She wears proper fitting shoes.   DDD (degenerative disc disease), cervical: She has limited ROM with lateral rotation.    DDD (degenerative disc disease), lumbar: She has intermittent lower back pain, which is excerbated by horseback riding.   Chondromalacia of patella, unspecified laterality: She has  no knee joint pain or joint swelling at this time.    Other medical conditions are listed as follows:   History of hypothyroidism  History of Epstein-Barr virus infection  History of diverticulosis  History of mycoplasma pneumonia   Orders: No orders of the defined types were placed in this encounter.  No orders of the defined types were  placed in this encounter.   Face-to-face time spent with patient was 30 minutes. Greater than 50% of time was spent in counseling and coordination of care.  Follow-Up Instructions: No follow-ups on file.   Gearldine Bienenstock, PA-C   I examined and evaluated the patient with Sherron Ales PA.  Patient appears to have paronychia ring finger.  The ulceration most likely is caused by the pressure from the nail.  She is having difficulty healing due to infection and Raynauds.  She has been advised to increase her prednisone to 5 mg and use nitroglycerin on regular basis.  She will also take baby aspirin daily.  Have advised her to follow-up with PCP regarding the infection.  The plan of care was discussed as noted above.  Pollyann Savoy, MD  Note - This record has been created using Animal nutritionist.  Chart creation errors have been sought, but may not always  have been located. Such creation errors do not reflect on  the standard of medical care.

## 2018-11-27 ENCOUNTER — Ambulatory Visit (INDEPENDENT_AMBULATORY_CARE_PROVIDER_SITE_OTHER): Payer: BLUE CROSS/BLUE SHIELD | Admitting: Rheumatology

## 2018-11-27 ENCOUNTER — Encounter: Payer: Self-pay | Admitting: Rheumatology

## 2018-11-27 ENCOUNTER — Other Ambulatory Visit: Payer: Self-pay

## 2018-11-27 VITALS — BP 150/81 | HR 76 | Resp 14 | Ht 67.0 in | Wt 209.8 lb

## 2018-11-27 DIAGNOSIS — M5030 Other cervical disc degeneration, unspecified cervical region: Secondary | ICD-10-CM

## 2018-11-27 DIAGNOSIS — M791 Myalgia, unspecified site: Secondary | ICD-10-CM | POA: Diagnosis not present

## 2018-11-27 DIAGNOSIS — Z8701 Personal history of pneumonia (recurrent): Secondary | ICD-10-CM

## 2018-11-27 DIAGNOSIS — M19071 Primary osteoarthritis, right ankle and foot: Secondary | ICD-10-CM

## 2018-11-27 DIAGNOSIS — M19042 Primary osteoarthritis, left hand: Secondary | ICD-10-CM

## 2018-11-27 DIAGNOSIS — M19041 Primary osteoarthritis, right hand: Secondary | ICD-10-CM | POA: Diagnosis not present

## 2018-11-27 DIAGNOSIS — L03012 Cellulitis of left finger: Secondary | ICD-10-CM

## 2018-11-27 DIAGNOSIS — I7300 Raynaud's syndrome without gangrene: Secondary | ICD-10-CM

## 2018-11-27 DIAGNOSIS — Z8639 Personal history of other endocrine, nutritional and metabolic disease: Secondary | ICD-10-CM

## 2018-11-27 DIAGNOSIS — M19072 Primary osteoarthritis, left ankle and foot: Secondary | ICD-10-CM

## 2018-11-27 DIAGNOSIS — M349 Systemic sclerosis, unspecified: Secondary | ICD-10-CM

## 2018-11-27 DIAGNOSIS — M2240 Chondromalacia patellae, unspecified knee: Secondary | ICD-10-CM

## 2018-11-27 DIAGNOSIS — Z8719 Personal history of other diseases of the digestive system: Secondary | ICD-10-CM

## 2018-11-27 DIAGNOSIS — Z8619 Personal history of other infectious and parasitic diseases: Secondary | ICD-10-CM

## 2018-11-27 DIAGNOSIS — M5136 Other intervertebral disc degeneration, lumbar region: Secondary | ICD-10-CM

## 2019-03-21 ENCOUNTER — Telehealth: Payer: Self-pay | Admitting: Pulmonary Disease

## 2019-03-22 NOTE — Telephone Encounter (Signed)
Scheduled pft for 04/04/2019-pr

## 2019-03-23 ENCOUNTER — Other Ambulatory Visit: Payer: Self-pay | Admitting: Pulmonary Disease

## 2019-03-27 NOTE — Progress Notes (Signed)
Office Visit Note  Patient: Paula [name]             Date of Birth: 09/30/1960           MRN: [id number]             PCP: Jones BalesLantelme, Bruce E, MD Referring: Jones BalesLantelme, Bruce E, MD Visit Date: 04/09/2019 Occupation: @GUAROCC @  Subjective:  Raynauds and joint pain.   History of Present Illness: Paula [name] is a 58 y.o. female with history of limited systemic sclerosis and osteoarthritis.  She states she has been under a lot of stress due to her recent move.  She has noticed some skin thickening on the tips of her fingers.  She is not having much of Raynolds symptoms currently.  She does not like taking amlodipine.  Although she uses nitroglycerin ointment.  She has not noticed much change in osteoarthritis symptoms.  She continues to have some discomfort in the right knee.  She has been experiencing some reflux symptoms.  She had PFTs done on April 04, 2019 by Dr. Ulyses JarredMcQuaid's office.  She has not heard the results yet.  Patient does not want to go to the cardiology practice potential about this, now because of co-pay being high as they are hospitalized.  She used to go to Dr. Verl DickerGanji's office in the past but had unpleasant interaction with one of the technician there.  She is willing to try it again.  Activities of Daily Living:  Patient reports morning stiffness for 0 minute.   Patient Denies nocturnal pain.  Difficulty dressing/grooming: Denies Difficulty climbing stairs: Reports Difficulty getting out of chair: Denies Difficulty using hands for taps, buttons, cutlery, and/or writing: Denies  Review of Systems  Constitutional: Positive for fatigue. Negative for night sweats, weight gain and weight loss.  HENT: Negative for mouth sores, trouble swallowing, trouble swallowing, mouth dryness and nose dryness.   Eyes: Negative for pain, redness, visual disturbance and dryness.  Respiratory: Negative for cough, shortness of breath and difficulty breathing.   Cardiovascular: Negative for  chest pain, palpitations, hypertension, irregular heartbeat and swelling in legs/feet.  Gastrointestinal: Negative for blood in stool, constipation and diarrhea.  Endocrine: Negative for increased urination.  Genitourinary: Negative for vaginal dryness.  Musculoskeletal: Positive for arthralgias, joint pain, myalgias and myalgias. Negative for joint swelling, muscle weakness, morning stiffness and muscle tenderness.  Skin: Positive for color change. Negative for rash, hair loss, skin tightness, ulcers and sensitivity to sunlight.  Allergic/Immunologic: Negative for susceptible to infections.  Neurological: Negative for dizziness, Paula loss, night sweats and weakness.  Hematological: Negative for swollen glands.  Psychiatric/Behavioral: Negative for depressed mood and sleep disturbance. The patient is not nervous/anxious.     PMFS History:  Patient Active Problem List   Diagnosis Date Noted   Limited systemic sclerosis (HCC) 11/07/2017   Primary osteoarthritis of both hands 11/07/2017   Chondromalacia of patella 11/07/2017   Primary osteoarthritis of both feet 11/07/2017   History of Epstein-Barr virus infection 11/07/2017   History of diverticulosis 11/07/2017   History of mycoplasma pneumonia 11/07/2017   History of hypothyroidism 11/07/2017   Hallux rigidus of right foot 05/03/2017   Pain in joint, ankle and foot 04/17/2014   Abnormality of gait 04/17/2014   Elevated parathyroid hormone 12/18/2013   Thyroid disease     Past Medical History:  Diagnosis Date   Anxiety    Arthritis    CREST syndrome (HCC)    Diverticulosis    Gastric ulcer  Hypercholesterolemia    Lyme disease    Thyroid disease    Vitamin B deficiency     Family History  Problem Relation Age of Onset   Heart attack Father    Diabetes Sister    Healthy Daughter    Healthy Son    Colon cancer Neg Hx    Pancreatic cancer Neg Hx    Rectal cancer Neg Hx    Stomach cancer  Neg Hx    Past Surgical History:  Procedure Laterality Date   KNEE ARTHROPLASTY     KNEE SURGERY  2007   OOPHORECTOMY  2004   SHOULDER SURGERY  03/2012   TOTAL SHOULDER ARTHROPLASTY     Social History   Social History Narrative   ** Merged History Encounter **       Immunization History  Administered Date(s) Administered   Influenza,inj,Quad PF,6+ Mos 06/24/2014   Tdap 04/04/2016     Objective: Vital Signs: BP (!) 150/89 (BP Location: Left Arm, Patient Position: Sitting, Cuff Size: Normal)    Pulse 74    Resp 14    Ht 5\' 7"  (1.702 m)    Wt 204 lb 6.4 oz (92.7 kg)    BMI 32.01 kg/m    Physical Exam Vitals signs and nursing note reviewed.  Constitutional:      Appearance: She is well-developed.  HENT:     Head: Normocephalic and atraumatic.  Eyes:     Conjunctiva/sclera: Conjunctivae normal.  Neck:     Musculoskeletal: Normal range of motion.  Cardiovascular:     Rate and Rhythm: Normal rate and regular rhythm.     Heart sounds: Normal heart sounds.  Pulmonary:     Effort: Pulmonary effort is normal.     Breath sounds: Normal breath sounds.  Abdominal:     General: Bowel sounds are normal.     Palpations: Abdomen is soft.  Lymphadenopathy:     Cervical: No cervical adenopathy.  Skin:    General: Skin is warm and dry.     Capillary Refill: Capillary refill takes less than 2 seconds.     Comments: Sclerodactyly noted  distal to MCPs.  Periungual erythema and capillary changes were noted.  Telangiectasias were noted.  Neurological:     Mental Status: She is alert and oriented to person, place, and time.  Psychiatric:        Behavior: Behavior normal.      Musculoskeletal Exam: C-spine thoracic and lumbar spine with good range of motion.  Shoulder joints elbow joints wrist joint and MCP joints with good range of motion.  She has DIP PIP thickening with no synovitis.  She also had some nailbed capillary changes.  Sclerodactyly was only distal to MCPs.  Hip  joints, knee joints, ankles and MTPs with good range of motion.  CDAI Exam: CDAI Score: -- Patient Global: --; Provider Global: -- Swollen: --; Tender: -- Joint Exam   No joint exam has been documented for this visit   There is currently no information documented on the homunculus. Go to the Rheumatology activity and complete the homunculus joint exam.  Investigation: No additional findings.  Imaging: No results found.  Recent Labs: Lab Results  Component Value Date   WBC 4.5 04/05/2018   HGB 13.7 04/05/2018   PLT 232 04/05/2018   NA 141 04/05/2018   K 5.3 04/05/2018   CL 109 04/05/2018   CO2 23 04/05/2018   GLUCOSE 127 (H) 04/05/2018   BUN 23 04/05/2018   CREATININE  0.78 04/05/2018   BILITOT 0.4 04/05/2018   ALKPHOS 70 03/09/2017   AST 17 04/05/2018   ALT 23 04/05/2018   PROT 6.2 04/05/2018   PROT 6.2 04/05/2018   ALBUMIN 4.2 03/09/2017   CALCIUM 10.9 (H) 04/05/2018   GFRAA 97 04/05/2018  March 22, 2019 CBC WBC 3.8, CMP calcium 10.3 mildly elevated, iron studies normal, LDL 112.6, B12 normal, pregnenolone normal  Speciality Comments: No specialty comments available.  Procedures:  No procedures performed Allergies: Celebrex [celecoxib] and Vioxx [rofecoxib]   Assessment / Plan:     Visit Diagnoses: Limited systemic sclerosis (HCC) - Sclerodactyly, nail fold capillary changes, raynaud's, fatigue, few telangiectasias, ANA+, anticentromere+, RF+.  Her symptoms are stable.  Her Raynaud's is mild.  She continues to have some reflux symptoms.  Patient had PFTs done at Dr. Ulyses Jarred office.  She has had echocardiogram in the past to Dr. Verl Dicker office.  She states she did not have very good experience with the technician and will hope that this visit will be better.  I will schedule a follow-up echocardiogram.  Raynaud's disease without gangrene -patient takes amlodipine on PRN basis.  Her blood pressure was elevated today.  Have advised her to take amlodipine on  regular basis. She uses nitroglycerin ointment.   Primary osteoarthritis of both hands-she continues to have some stiffness in her hands.  Chondromalacia of patella, unspecified laterality-she has been having crepitus and discomfort in her knees with climbing stairs.  Muscle strengthening was discussed.  Primary osteoarthritis of both feet-currently not having much discomfort.  DDD (degenerative disc disease), cervical-manageable  DDD (degenerative disc disease), lumbar-chronic pain but tolerable  History of mycoplasma pneumonia-patient states that she has chronic fatigue due to prior mycoplasma infection.  History of hypothyroidism  History of diverticulosis  History of Epstein-Barr virus infection  Orders: No orders of the defined types were placed in this encounter.  No orders of the defined types were placed in this encounter.   Face-to-face time spent with patient was 30 minutes. Greater than 50% of time was spent in counseling and coordination of care.  Follow-Up Instructions: Return in about 6 months (around 10/07/2019) for Scleroderma.   Pollyann Savoy, MD  Note - This record has been created using Animal nutritionist.  Chart creation errors have been sought, but may not always  have been located. Such creation errors do not reflect on  the standard of medical care.

## 2019-03-31 ENCOUNTER — Other Ambulatory Visit (HOSPITAL_COMMUNITY)
Admission: RE | Admit: 2019-03-31 | Discharge: 2019-03-31 | Disposition: A | Discharge status: Home / Self Care | Payer: BC Managed Care – PPO | Source: Ambulatory Visit | Attending: Pulmonary Disease | Admitting: Pulmonary Disease

## 2019-03-31 DIAGNOSIS — Z20828 Contact with and (suspected) exposure to other viral communicable diseases: Secondary | ICD-10-CM | POA: Insufficient documentation

## 2019-03-31 DIAGNOSIS — Z01812 Encounter for preprocedural laboratory examination: Secondary | ICD-10-CM | POA: Diagnosis not present

## 2019-04-01 LAB — NOVEL CORONAVIRUS, NAA (HOSP ORDER, SEND-OUT TO REF LAB; TAT 18-24 HRS): SARS-CoV-2, NAA: NOT DETECTED

## 2019-04-03 ENCOUNTER — Other Ambulatory Visit: Payer: Self-pay | Admitting: *Deleted

## 2019-04-03 DIAGNOSIS — I272 Pulmonary hypertension, unspecified: Secondary | ICD-10-CM

## 2019-04-04 ENCOUNTER — Ambulatory Visit (INDEPENDENT_AMBULATORY_CARE_PROVIDER_SITE_OTHER): Payer: BC Managed Care – PPO | Admitting: Pulmonary Disease

## 2019-04-04 ENCOUNTER — Other Ambulatory Visit: Payer: Self-pay

## 2019-04-04 DIAGNOSIS — I272 Pulmonary hypertension, unspecified: Secondary | ICD-10-CM

## 2019-04-04 LAB — PULMONARY FUNCTION TEST
DL/VA % pred: 149 %
DL/VA: 6.17 ml/min/mmHg/L
DLCO unc % pred: 152 %
DLCO unc: 34.14 ml/min/mmHg
FEF 25-75 Pre: 4.75 L/sec
FEF2575-%Pred-Pre: 184 %
FEV1-%Pred-Pre: 113 %
FEV1-Pre: 3.25 L
FEV1FVC-%Pred-Pre: 110 %
FEV6-%Pred-Pre: 104 %
FEV6-Pre: 3.74 L
FEV6FVC-%Pred-Pre: 103 %
FVC-%Pred-Pre: 101 %
FVC-Pre: 3.75 L
Pre FEV1/FVC ratio: 87 %
Pre FEV6/FVC Ratio: 100 %
RV % pred: 102 %
RV: 2.18 L
TLC % pred: 107 %
TLC: 5.94 L

## 2019-04-04 NOTE — Progress Notes (Signed)
Spiro/DLCO/Pleth performed today. Patient refused Ventolin therefore no post spirometry performed.

## 2019-04-09 ENCOUNTER — Encounter: Payer: Self-pay | Admitting: Rheumatology

## 2019-04-09 ENCOUNTER — Other Ambulatory Visit: Payer: Self-pay | Admitting: *Deleted

## 2019-04-09 ENCOUNTER — Ambulatory Visit (INDEPENDENT_AMBULATORY_CARE_PROVIDER_SITE_OTHER): Payer: BC Managed Care – PPO | Admitting: Rheumatology

## 2019-04-09 ENCOUNTER — Other Ambulatory Visit: Payer: Self-pay

## 2019-04-09 VITALS — BP 150/89 | HR 74 | Resp 14 | Ht 67.0 in | Wt 204.4 lb

## 2019-04-09 DIAGNOSIS — M19041 Primary osteoarthritis, right hand: Secondary | ICD-10-CM | POA: Diagnosis not present

## 2019-04-09 DIAGNOSIS — M19072 Primary osteoarthritis, left ankle and foot: Secondary | ICD-10-CM

## 2019-04-09 DIAGNOSIS — Z8619 Personal history of other infectious and parasitic diseases: Secondary | ICD-10-CM

## 2019-04-09 DIAGNOSIS — I7300 Raynaud's syndrome without gangrene: Secondary | ICD-10-CM | POA: Diagnosis not present

## 2019-04-09 DIAGNOSIS — M349 Systemic sclerosis, unspecified: Secondary | ICD-10-CM | POA: Diagnosis not present

## 2019-04-09 DIAGNOSIS — Z8719 Personal history of other diseases of the digestive system: Secondary | ICD-10-CM

## 2019-04-09 DIAGNOSIS — M19042 Primary osteoarthritis, left hand: Secondary | ICD-10-CM

## 2019-04-09 DIAGNOSIS — M2240 Chondromalacia patellae, unspecified knee: Secondary | ICD-10-CM | POA: Diagnosis not present

## 2019-04-09 DIAGNOSIS — M19071 Primary osteoarthritis, right ankle and foot: Secondary | ICD-10-CM

## 2019-04-09 DIAGNOSIS — Z8639 Personal history of other endocrine, nutritional and metabolic disease: Secondary | ICD-10-CM

## 2019-04-09 DIAGNOSIS — M5030 Other cervical disc degeneration, unspecified cervical region: Secondary | ICD-10-CM

## 2019-04-09 DIAGNOSIS — M5136 Other intervertebral disc degeneration, lumbar region: Secondary | ICD-10-CM

## 2019-04-09 DIAGNOSIS — Z8701 Personal history of pneumonia (recurrent): Secondary | ICD-10-CM

## 2019-04-10 ENCOUNTER — Telehealth: Payer: Self-pay | Admitting: Rheumatology

## 2019-04-10 NOTE — Telephone Encounter (Signed)
Please advise. Thank you

## 2019-04-10 NOTE — Telephone Encounter (Signed)
Beverly from [facility] called stating they received referral for echocardiogram and requesting a return call to let her know if authorization is needed.   [phone]

## 2019-04-12 ENCOUNTER — Telehealth: Payer: Self-pay | Admitting: *Deleted

## 2019-04-12 NOTE — Telephone Encounter (Signed)
Received vm from [place] with Dr. Einar Gip office at [facility] stating needs to know if pt insurance requires PA for echocardiogram, it did require it and I called beverly back and got the after hours call service to have [place] to return my call.   PA #   Order ID: [id number] Request Status:  Authorized Health Plan: [employer]   Valid Dates: 04/12/2019 - 05/11/2019 for echocardiogram

## 2019-04-13 ENCOUNTER — Other Ambulatory Visit: Payer: Self-pay | Admitting: Rheumatology

## 2019-04-13 DIAGNOSIS — M349 Systemic sclerosis, unspecified: Secondary | ICD-10-CM

## 2019-04-13 NOTE — Telephone Encounter (Signed)
Beverly aware of the PA

## 2019-04-27 ENCOUNTER — Other Ambulatory Visit: Payer: BC Managed Care – PPO

## 2019-05-08 ENCOUNTER — Other Ambulatory Visit: Payer: Self-pay

## 2019-05-08 ENCOUNTER — Ambulatory Visit (INDEPENDENT_AMBULATORY_CARE_PROVIDER_SITE_OTHER): Payer: BC Managed Care – PPO

## 2019-05-08 DIAGNOSIS — M349 Systemic sclerosis, unspecified: Secondary | ICD-10-CM

## 2019-05-08 DIAGNOSIS — I272 Pulmonary hypertension, unspecified: Secondary | ICD-10-CM

## 2019-05-19 IMAGING — NM NM HEPATO W/GB/PHARM/[PERSON_NAME]
1 series · 6 of 6 positions shown · non-contrast
Comparison: Ultrasound the abdomen of 03/10/2017

CLINICAL DATA: Right upper quadrant pain radiating to the back for
3 weeks

EXAM:
NUCLEAR MEDICINE HEPATOBILIARY IMAGING WITH GALLBLADDER EF
TECHNIQUE: Sequential images of the abdomen were obtained [DATE] minutes
following intravenous administration of radiopharmaceutical. After
oral ingestion of Ensure, gallbladder ejection fraction was
determined. At 60 min, normal ejection fraction is greater than 33%.
RADIOPHARMACEUTICALS:  4.9 mCi [id number]  Choletec IV

[Series 1: hepato · 4.46mm/px · 6 of 60 frames shown]
[frame 6/60]
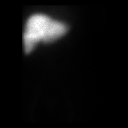
[frame 16/60]
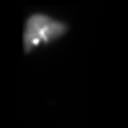
[frame 26/60]
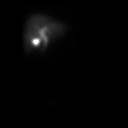
[frame 36/60]
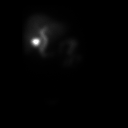
[frame 46/60]
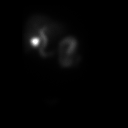
[frame 56/60]
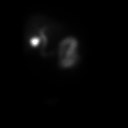

[6 of 6 positions shown; findings below may reference images not displayed]

FINDINGS: Prompt uptake and biliary excretion of activity by the liver is
seen. Gallbladder activity is visualized, consistent with patency of
cystic duct. Biliary activity passes into small bowel, consistent
with patent common bile duct.

Calculated gallbladder ejection fraction is 42%. (Normal gallbladder
ejection fraction with Ensure is greater than 33%.) the patient did
experience some pain after ingestion of Ensure.
IMPRESSION: 1. Negative nuclear medicine hepatobiliary scan.
2. Normal gallbladder ejection fraction of 42%. Some pain was noted
after ingestion of Ensure.

## 2019-07-26 ENCOUNTER — Other Ambulatory Visit: Payer: Self-pay | Admitting: Rheumatology

## 2019-07-26 NOTE — Telephone Encounter (Signed)
Last visit: 04/09/19 Next visit: 10/08/19  Okay to refill per Dr. Corliss Skains

## 2019-10-01 NOTE — Progress Notes (Signed)
Office Visit Note  Patient: Paula [name]             Date of Birth: 1960/08/23           MRN: [id number]             PCP: Zane Herald, MD Referring: Zane Herald, MD Visit Date: 10/08/2019 Occupation: @GUAROCC @  Subjective:  Pain in hands.   History of Present Illness: Paula [name] is a 59 y.o. female with history of limited systemic sclerosis, osteoarthritis and degenerative disc disease.  She states last winter she developed ulcer under her right index finger nail which eventually healed.  She also developed paronychia which was treated with antibiotics.  She has lost some tissue in that area.  She has been taking vasodilators on a regular basis now.  She also used topical nitroglycerin at the time.  She continues to have discomfort in her hands and feet due to underlying osteoarthritis.  She continues to have discomfort from her underlying disc disease.  She has appointment coming up with the chiropractor.  She had PFTs and echocardiogram last winter which was a stable.  Activities of Daily Living:  Patient reports morning stiffness for 0 minutes.   Patient Denies nocturnal pain.  Difficulty dressing/grooming: Denies Difficulty climbing stairs: Reports Difficulty getting out of chair: Denies Difficulty using hands for taps, buttons, cutlery, and/or writing: Reports  Review of Systems  Constitutional: Negative for fatigue, night sweats, weight gain and weight loss.  HENT: Negative for mouth sores, trouble swallowing, trouble swallowing, mouth dryness and nose dryness.   Eyes: Positive for itching. Negative for pain, redness, visual disturbance and dryness.  Respiratory: Negative for cough, shortness of breath and difficulty breathing.   Cardiovascular: Negative for chest pain, palpitations, hypertension, irregular heartbeat and swelling in legs/feet.  Gastrointestinal: Negative for blood in stool, constipation and diarrhea.  Endocrine: Negative for increased  urination.  Genitourinary: Negative for difficulty urinating, painful urination and vaginal dryness.  Musculoskeletal: Positive for arthralgias, joint pain and joint swelling. Negative for myalgias, muscle weakness, morning stiffness, muscle tenderness and myalgias.  Skin: Positive for color change. Negative for rash, hair loss, redness, skin tightness, ulcers and sensitivity to sunlight.  Allergic/Immunologic: Negative for susceptible to infections.  Neurological: Negative for dizziness, numbness, headaches, memory loss, night sweats and weakness.  Hematological: Negative for bruising/bleeding tendency and swollen glands.  Psychiatric/Behavioral: Negative for depressed mood, confusion and sleep disturbance. The patient is not nervous/anxious.     PMFS History:  Patient Active Problem List   Diagnosis Date Noted  . Limited systemic sclerosis ([place]) 11/07/2017  . Primary osteoarthritis of both hands 11/07/2017  . Chondromalacia of patella 11/07/2017  . Primary osteoarthritis of both feet 11/07/2017  . History of Epstein-Barr virus infection 11/07/2017  . History of diverticulosis 11/07/2017  . History of mycoplasma pneumonia 11/07/2017  . History of hypothyroidism 11/07/2017  . Hallux rigidus of right foot 05/03/2017  . Pain in joint, ankle and foot 04/17/2014  . Abnormality of gait 04/17/2014  . Elevated parathyroid hormone 12/18/2013  . Thyroid disease     Past Medical History:  Diagnosis Date  . Anxiety   . Arthritis   . CREST syndrome ([place])   . Diverticulosis   . Gastric ulcer   . Hypercholesterolemia   . Lyme disease   . Thyroid disease   . Vitamin B deficiency     Family History  Problem Relation Age of Onset  . Heart attack Father   . Kidney  disease Father   . Diabetes Sister   . Healthy Daughter   . Healthy Son   . Colon cancer Neg Hx   . Pancreatic cancer Neg Hx   . Rectal cancer Neg Hx   . Stomach cancer Neg Hx    Past Surgical History:  Procedure  Laterality Date  . KNEE ARTHROPLASTY    . KNEE SURGERY  2007  . OOPHORECTOMY  2004  . SHOULDER SURGERY  03/2012  . TOTAL SHOULDER ARTHROPLASTY     Social History   Social History Narrative   ** Merged History Encounter **       Immunization History  Administered Date(s) Administered  . Influenza,inj,Quad PF,6+ Mos 06/24/2014  . Tdap 04/04/2016     Objective: Vital Signs: BP (!) 153/89 (BP Location: Left Arm, Patient Position: Sitting, Cuff Size: Normal)   Pulse 71   Resp 15   Ht 5\' 7"  (1.702 m)   Wt 216 lb (98 kg)   BMI 33.83 kg/m    Physical Exam Vitals and nursing note reviewed.  Constitutional:      Appearance: She is well-developed.  HENT:     Head: Normocephalic and atraumatic.  Eyes:     Conjunctiva/sclera: Conjunctivae normal.  Cardiovascular:     Rate and Rhythm: Normal rate and regular rhythm.     Heart sounds: Normal heart sounds.  Pulmonary:     Effort: Pulmonary effort is normal.     Breath sounds: Normal breath sounds.  Abdominal:     General: Bowel sounds are normal.     Palpations: Abdomen is soft.  Musculoskeletal:     Cervical back: Normal range of motion.  Lymphadenopathy:     Cervical: No cervical adenopathy.  Skin:    General: Skin is warm and dry.     Capillary Refill: Capillary refill takes less than 2 seconds.     Comments: Hyperpigmented rough patch was noted on her back which could be morphea.  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  Periungual erythema was noted.  Few telangiectasias were noted.  Neurological:     Mental Status: She is alert and oriented to person, place, and time.  Psychiatric:        Behavior: Behavior normal.      Musculoskeletal Exam: C-spine thoracic and lumbar spine were in good range of motion.  Shoulder joints elbow joints wrist joints with good range of motion.  She has severe PIP and DIP thickening with ankylosis and subluxation of her left second PIP joint.  Hip joints, knee joints, ankles with good  range of motion.  She has lost digital pulp over her right index finger due to recent ulceration per patient.  CDAI Exam: CDAI Score: -- Patient Global: --; Provider Global: -- Swollen: --; Tender: -- Joint Exam 10/08/2019   No joint exam has been documented for this visit   There is currently no information documented on the homunculus. Go to the Rheumatology activity and complete the homunculus joint exam.  Investigation: No additional findings.  Imaging: No results found.  Recent Labs: Lab Results  Component Value Date   WBC 4.5 04/05/2018   HGB 13.7 04/05/2018   PLT 232 04/05/2018   NA 141 04/05/2018   K 5.3 04/05/2018   CL 109 04/05/2018   CO2 23 04/05/2018   GLUCOSE 127 (H) 04/05/2018   BUN 23 04/05/2018   CREATININE 0.78 04/05/2018   BILITOT 0.4 04/05/2018   ALKPHOS 70 03/09/2017   AST 17 04/05/2018  ALT 23 04/05/2018   PROT 6.2 04/05/2018   PROT 6.2 04/05/2018   ALBUMIN 4.2 03/09/2017   CALCIUM 10.9 (H) 04/05/2018   GFRAA 97 04/05/2018    Speciality Comments: No specialty comments available.  Procedures:  No procedures performed Allergies: Celebrex [celecoxib] and Vioxx [rofecoxib]   Assessment / Plan:     Visit Diagnoses: Limited systemic sclerosis (HCC) - Sclerodactyly, nail fold capillary changes, raynaud's, fatigue, few telangiectasias, ANA+, anticentromere+, RF+.  Patient had right index finger distal ulcer which healed gradually per patient.  She has lost some digital pulp.  There is no progression of the skin disease.  Keeping body temperature warm was emphasized.  Patient had PFTs and echocardiogram in the winter 2020.  Patient will be getting labs to her PCP.  She will forward labs to [place].  Raynaud's disease without gangrene - patient takes amlodipine on PRN basis.  Patient states she cannot take taking amlodipine on a regular basis.  Rash-hyperpigmented rough patch was noted on her back.  Have advised her to discuss see dermatologist.  It raises  concern about possible morphea.  Primary osteoarthritis of both hands-she has severe osteoarthritis in her hands.  Joint protection was discussed.  Joint protection was discussed.  Chondromalacia of patella, unspecified laterality-currently not causing much discomfort.  Primary osteoarthritis of both feet-chronic pain.  DDD (degenerative disc disease), cervical-she goes to a chiropractor.  DDD (degenerative disc disease), lumbar-goes to chiropractor. History of hypothyroidism  History of diverticulosis  History of Epstein-Barr virus infection  History of mycoplasma pneumonia  Orders: No orders of the defined types were placed in this encounter.  No orders of the defined types were placed in this encounter.    Follow-Up Instructions: Return in about 6 months (around 04/09/2020) for Scleroderma, Osteoarthritis.   Pollyann Savoy, MD  Note - This record has been created using Animal nutritionist.  Chart creation errors have been sought, but may not always  have been located. Such creation errors do not reflect on  the standard of medical care.

## 2019-10-08 ENCOUNTER — Encounter: Payer: Self-pay | Admitting: Rheumatology

## 2019-10-08 ENCOUNTER — Ambulatory Visit (INDEPENDENT_AMBULATORY_CARE_PROVIDER_SITE_OTHER): Payer: BC Managed Care – PPO | Admitting: Rheumatology

## 2019-10-08 ENCOUNTER — Other Ambulatory Visit: Payer: Self-pay

## 2019-10-08 VITALS — BP 153/89 | HR 71 | Resp 15 | Ht 67.0 in | Wt 216.0 lb

## 2019-10-08 DIAGNOSIS — M2240 Chondromalacia patellae, unspecified knee: Secondary | ICD-10-CM

## 2019-10-08 DIAGNOSIS — Z8701 Personal history of pneumonia (recurrent): Secondary | ICD-10-CM

## 2019-10-08 DIAGNOSIS — M19042 Primary osteoarthritis, left hand: Secondary | ICD-10-CM

## 2019-10-08 DIAGNOSIS — Z8619 Personal history of other infectious and parasitic diseases: Secondary | ICD-10-CM

## 2019-10-08 DIAGNOSIS — M349 Systemic sclerosis, unspecified: Secondary | ICD-10-CM

## 2019-10-08 DIAGNOSIS — R21 Rash and other nonspecific skin eruption: Secondary | ICD-10-CM | POA: Diagnosis not present

## 2019-10-08 DIAGNOSIS — M19041 Primary osteoarthritis, right hand: Secondary | ICD-10-CM | POA: Diagnosis not present

## 2019-10-08 DIAGNOSIS — Z8719 Personal history of other diseases of the digestive system: Secondary | ICD-10-CM

## 2019-10-08 DIAGNOSIS — M5030 Other cervical disc degeneration, unspecified cervical region: Secondary | ICD-10-CM

## 2019-10-08 DIAGNOSIS — M19072 Primary osteoarthritis, left ankle and foot: Secondary | ICD-10-CM

## 2019-10-08 DIAGNOSIS — Z8639 Personal history of other endocrine, nutritional and metabolic disease: Secondary | ICD-10-CM

## 2019-10-08 DIAGNOSIS — M5136 Other intervertebral disc degeneration, lumbar region: Secondary | ICD-10-CM

## 2019-10-08 DIAGNOSIS — M51369 Other intervertebral disc degeneration, lumbar region without mention of lumbar back pain or lower extremity pain: Secondary | ICD-10-CM

## 2019-10-08 DIAGNOSIS — I7300 Raynaud's syndrome without gangrene: Secondary | ICD-10-CM

## 2019-10-08 DIAGNOSIS — M19071 Primary osteoarthritis, right ankle and foot: Secondary | ICD-10-CM

## 2019-10-29 ENCOUNTER — Encounter: Payer: Self-pay | Admitting: Rheumatology

## 2019-11-07 ENCOUNTER — Ambulatory Visit: Payer: BC Managed Care – PPO | Admitting: Gastroenterology

## 2019-11-12 ENCOUNTER — Ambulatory Visit (INDEPENDENT_AMBULATORY_CARE_PROVIDER_SITE_OTHER): Payer: BC Managed Care – PPO | Admitting: Gastroenterology

## 2019-11-12 ENCOUNTER — Encounter: Payer: Self-pay | Admitting: Gastroenterology

## 2019-11-12 VITALS — BP 126/72 | HR 88 | Temp 97.9°F | Ht 66.3 in | Wt 215.5 lb

## 2019-11-12 DIAGNOSIS — R1310 Dysphagia, unspecified: Secondary | ICD-10-CM

## 2019-11-12 DIAGNOSIS — K219 Gastro-esophageal reflux disease without esophagitis: Secondary | ICD-10-CM

## 2019-11-12 MED ORDER — OMEPRAZOLE 40 MG PO CPDR: 40.0000 mg | DELAYED_RELEASE_CAPSULE | Freq: Every day | ORAL | 11 refills | Status: DC

## 2019-11-12 NOTE — Progress Notes (Signed)
    History of Present Illness: This is a 59 year old female who relates worsening problems with acid reflux for the past few months as well as solid food dysphagia.  She has taken Pepcid infrequently however she has primarily tried to address her symptoms with dietary changes.  She states she needs to drink plenty of fluids with meals to help solid foods pass.  She has a history of limited systemic sclerosis.  EGD 03/2016 - Normal esophagus. Dilated. - Gastritis. Biopsied. Reactive gastropathy, chronic gastritis  - Normal duodenal bulb and second portion of the duodenum.  Current Medications, Allergies, Past Medical History, Past Surgical History, Family History and Social History were reviewed in [employer] record.   Physical Exam: General: Well developed, well nourished, no acute distress Head: Normocephalic and atraumatic Eyes:  sclerae anicteric, EOMI Ears: Normal auditory acuity Mouth: Not examined, mask on during Covid-19 pandemic Lungs: Clear throughout to auscultation Heart: Regular rate and rhythm; no murmurs, rubs or bruits Abdomen: Soft, non tender and non distended. No masses, hepatosplenomegaly or hernias noted. Normal Bowel sounds Rectal: Not done  Musculoskeletal: Symmetrical with no gross deformities  Pulses:  Normal pulses noted Extremities: No clubbing, cyanosis, edema or deformities noted Neurological: Alert oriented x 4, grossly nonfocal Psychological:  Alert and cooperative. Normal mood and affect   Assessment and Recommendations:  1. GERD. Dysphagia, solids.  Rule out esophagitis, stricture, esophageal impact of systemic sclerosis.  Closely follow antireflux measures.  Begin omeprazole 40 mg daily.  If symptoms do not adequately respond proceed with endoscopy with possible dilation and possible biopsy. REV in 8 weeks.  2.  Limited systemic sclerosis followed by Dr. Corliss Skains.   3.  CRC screening, average risk.  A 10-year interval  colonoscopy is recommended in July 2025.

## 2019-11-12 NOTE — Patient Instructions (Signed)
We have sent the following medications to your pharmacy for you to pick up at your convenience: omeprazole 40 mg daily.   Patient advised to avoid spicy, acidic, citrus, chocolate, mints, fruit and fruit juices.  Limit the intake of caffeine, alcohol and Soda.  Don't exercise too soon after eating.  Don't lie down within 3-4 hours of eating.  Elevate the head of your bed.  Thank you for choosing me and [facility].  Paula [name]. Pleas Koch., MD., Clementeen Graham

## 2019-12-20 ENCOUNTER — Telehealth: Payer: Self-pay | Admitting: *Deleted

## 2019-12-20 NOTE — Telephone Encounter (Signed)
Labs Received from Dr. Duke Salvia Drawn on 10/29/2019 Reviewed by Sherron Ales PA-C  BUN/Creatinine Ration 29 Chloride 107 Calcium 10.7 A/G ratio 2.3  Patient is on Nitro BID 2% ointment.

## 2020-01-03 ENCOUNTER — Ambulatory Visit: Payer: BC Managed Care – PPO | Admitting: Gastroenterology

## 2020-03-26 NOTE — Progress Notes (Signed)
Office Visit Note  Patient: Paula [name]             Date of Birth: 1960-09-09           MRN: [id number]             PCP: Jones Bales, MD Referring: Jones Bales, MD Visit Date: 04/07/2020 Occupation: @GUAROCC @  Subjective:  Questions (Several questions regarding medical complaints)   History of Present Illness: Shanicka Oldenkamp is a 59 y.o. female with history of scleroderma, osteoarthritis.  She states she was seen by dermatologist for the rash on her back and was diagnosed with morphea.  She believes her skin is getting tighter on her hands and would like to see a rheumatologist at [facility].  She also wants more aggressive therapy for the treatment of morphea.  She was seen by pulmonologist and cardiologist about 2 years ago.  Have advised her to make a follow-up appointment.  She is very much concerned about persistent EBV infection.  She states she has been seen by ID physicians in the past and was not given any suggestions.  She has been treated by her PCP.  She wants to see an [facility] at [facility].  Activities of Daily Living:  Patient reports morning stiffness for 0 minutes.   Patient Denies nocturnal pain.  Difficulty dressing/grooming: Denies Difficulty climbing stairs: Reports Difficulty getting out of chair: Reports Difficulty using hands for taps, buttons, cutlery, and/or writing: Reports  Review of Systems  Constitutional: Positive for fatigue.  HENT: Positive for mouth dryness. Negative for mouth sores and nose dryness.   Eyes: Positive for dryness. Negative for itching and visual disturbance.  Respiratory: Negative for shortness of breath and difficulty breathing.   Cardiovascular: Negative for chest pain, palpitations and swelling in legs/feet.  Gastrointestinal: Negative for blood in stool, constipation and diarrhea.  Endocrine: Negative for increased urination.  Genitourinary: Negative for difficulty urinating.  Musculoskeletal: Positive for arthralgias,  joint pain and joint swelling. Negative for myalgias, muscle weakness, morning stiffness, muscle tenderness and myalgias.  Skin: Negative for rash and redness.  Allergic/Immunologic: Positive for susceptible to infections.  Neurological: Positive for dizziness and numbness. Negative for headaches, memory loss and weakness.  Hematological: Negative for bruising/bleeding tendency.  Psychiatric/Behavioral: Negative for confusion and sleep disturbance.    PMFS History:  Patient Active Problem List   Diagnosis Date Noted   Limited systemic sclerosis (HCC) 11/07/2017   Primary osteoarthritis of both hands 11/07/2017   Chondromalacia of patella 11/07/2017   Primary osteoarthritis of both feet 11/07/2017   History of Epstein-Barr virus infection 11/07/2017   History of diverticulosis 11/07/2017   History of mycoplasma pneumonia 11/07/2017   History of hypothyroidism 11/07/2017   Hallux rigidus of right foot 05/03/2017   Pain in joint, ankle and foot 04/17/2014   Abnormality of gait 04/17/2014   Elevated parathyroid hormone 12/18/2013   Thyroid disease     Past Medical History:  Diagnosis Date   Anxiety    Arthritis    CREST syndrome (HCC)    Diverticulosis    Gastric ulcer    Hypercholesterolemia    Lyme disease    Thyroid disease    Vitamin B deficiency     Family History  Problem Relation Age of Onset   Heart attack Father    Kidney disease Father    Diabetes Sister    Healthy Daughter    Healthy Son    Colon cancer Neg Hx    Pancreatic cancer Neg  Hx    Rectal cancer Neg Hx    Stomach cancer Neg Hx    Past Surgical History:  Procedure Laterality Date   KNEE ARTHROPLASTY     KNEE SURGERY  2007   OOPHORECTOMY  2004   SHOULDER SURGERY  03/2012   TOTAL SHOULDER ARTHROPLASTY     Social History   Social History Narrative   ** Merged History Encounter **       Immunization History  Administered Date(s) Administered    Influenza,inj,Quad PF,6+ Mos 06/24/2014   Tdap 04/04/2016     Objective: Vital Signs: BP (!) 156/92 (BP Location: Left Arm, Patient Position: Sitting, Cuff Size: Small)    Pulse 81    Resp 14    Ht 5\' 7"  (1.702 m)    Wt 218 lb 3.2 oz (99 kg)    BMI 34.17 kg/m    Physical Exam Vitals and nursing note reviewed.  Constitutional:      Appearance: She is well-developed.  HENT:     Head: Normocephalic and atraumatic.  Eyes:     Conjunctiva/sclera: Conjunctivae normal.  Cardiovascular:     Rate and Rhythm: Normal rate and regular rhythm.     Heart sounds: Normal heart sounds.  Pulmonary:     Effort: Pulmonary effort is normal.     Breath sounds: Normal breath sounds.  Abdominal:     General: Bowel sounds are normal.     Palpations: Abdomen is soft.  Musculoskeletal:     Cervical back: Normal range of motion.  Lymphadenopathy:     Cervical: No cervical adenopathy.  Skin:    General: Skin is warm and dry.     Capillary Refill: Capillary refill takes less than 2 seconds.     Comments: Hyperpigmented lesion was noted over the thoracic spine.  Nailbed capillary changes were noted.  Neurological:     Mental Status: She is alert and oriented to person, place, and time.  Psychiatric:        Behavior: Behavior normal.      Musculoskeletal Exam: C-spine was in good range of motion.  Shoulder joints, elbow joints, wrist joints, MCPs PIPs were in good range of motion.  She is some DIP thickening with no synovitis.  Hip joints, knee joints, ankles, MTPs and PIPs with good range of motion.  CDAI Exam: CDAI Score: -- Patient Global: --; Provider Global: -- Swollen: --; Tender: -- Joint Exam 04/07/2020   No joint exam has been documented for this visit   There is currently no information documented on the homunculus. Go to the Rheumatology activity and complete the homunculus joint exam.  Investigation: No additional findings.  Imaging: No results found.  Recent Labs: Lab Results   Component Value Date   WBC 4.5 04/05/2018   HGB 13.7 04/05/2018   PLT 232 04/05/2018   NA 141 04/05/2018   K 5.3 04/05/2018   CL 109 04/05/2018   CO2 23 04/05/2018   GLUCOSE 127 (H) 04/05/2018   BUN 23 04/05/2018   CREATININE 0.78 04/05/2018   BILITOT 0.4 04/05/2018   ALKPHOS 70 03/09/2017   AST 17 04/05/2018   ALT 23 04/05/2018   PROT 6.2 04/05/2018   PROT 6.2 04/05/2018   ALBUMIN 4.2 03/09/2017   CALCIUM 10.9 (H) 04/05/2018   GFRAA 97 04/05/2018    Speciality Comments: No specialty comments available.  Procedures:  No procedures performed Allergies: Celebrex [celecoxib] and Vioxx [rofecoxib]   Assessment / Plan:     Visit Diagnoses: Limited systemic  sclerosis (HCC) - Sclerodactyly, nail fold capillary changes, raynaud's, fatigue, few telangiectasias, ANA+, anticentromere+, RF+.  Patient notices some increased tightness in her skin.  Mild sclerodactyly was noted.  Nailbed capillary changes were noted.  She would like to transfer care to [facility] to see if more aggressive therapy is available for her.  I reviewed her chart and reviewed the records from the pulmonologist and the cardiologist.  She will need repeat PFTs and echocardiogram.  Raynaud's disease without gangrene - patient takes amlodipine on PRN basis.    Rash - hyperpigmented rough patch was noted on her back.  Patient states she was seen by dermatologist and was given the diagnosis of morphea..  Primary osteoarthritis of both hands-she has severe DIP thickening consistent with osteoarthritis.  Chondromalacia of patella, unspecified laterality-chronic discomfort.  Primary osteoarthritis of both feet-chronic pain.  DDD (degenerative disc disease), cervical - goes to chiropractor.  DDD (degenerative disc disease), lumbar - goes to chiropractor.  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  She would like to be referred to [facility]  immunology clinic for evaluation.  We will make the referral per her request.  History of diverticulosis  History of mycoplasma pneumonia  Orders: Orders Placed This Encounter  Procedures   Ambulatory referral to Rheumatology   Ambulatory referral to Immunology   No orders of the defined types were placed in this encounter.    Follow-Up Instructions: Return if symptoms worsen or fail to improve, for Scleroderma.   Pollyann Savoy, MD  Note - This record has been created using Animal nutritionist.  Chart creation errors have been sought, but may not always  have been located. Such creation errors do not reflect on  the standard of medical care.

## 2020-04-07 ENCOUNTER — Encounter: Payer: Self-pay | Admitting: Rheumatology

## 2020-04-07 ENCOUNTER — Other Ambulatory Visit: Payer: Self-pay

## 2020-04-07 ENCOUNTER — Ambulatory Visit (INDEPENDENT_AMBULATORY_CARE_PROVIDER_SITE_OTHER): Payer: BC Managed Care – PPO | Admitting: Rheumatology

## 2020-04-07 VITALS — BP 156/92 | HR 81 | Resp 14 | Ht 67.0 in | Wt 218.2 lb

## 2020-04-07 DIAGNOSIS — M349 Systemic sclerosis, unspecified: Secondary | ICD-10-CM | POA: Diagnosis not present

## 2020-04-07 DIAGNOSIS — R21 Rash and other nonspecific skin eruption: Secondary | ICD-10-CM | POA: Diagnosis not present

## 2020-04-07 DIAGNOSIS — Z8619 Personal history of other infectious and parasitic diseases: Secondary | ICD-10-CM

## 2020-04-07 DIAGNOSIS — M19041 Primary osteoarthritis, right hand: Secondary | ICD-10-CM | POA: Diagnosis not present

## 2020-04-07 DIAGNOSIS — M19042 Primary osteoarthritis, left hand: Secondary | ICD-10-CM

## 2020-04-07 DIAGNOSIS — M2240 Chondromalacia patellae, unspecified knee: Secondary | ICD-10-CM

## 2020-04-07 DIAGNOSIS — M19071 Primary osteoarthritis, right ankle and foot: Secondary | ICD-10-CM

## 2020-04-07 DIAGNOSIS — M5030 Other cervical disc degeneration, unspecified cervical region: Secondary | ICD-10-CM

## 2020-04-07 DIAGNOSIS — Z8639 Personal history of other endocrine, nutritional and metabolic disease: Secondary | ICD-10-CM

## 2020-04-07 DIAGNOSIS — I7300 Raynaud's syndrome without gangrene: Secondary | ICD-10-CM | POA: Diagnosis not present

## 2020-04-07 DIAGNOSIS — Z8719 Personal history of other diseases of the digestive system: Secondary | ICD-10-CM

## 2020-04-07 DIAGNOSIS — M19072 Primary osteoarthritis, left ankle and foot: Secondary | ICD-10-CM

## 2020-04-07 DIAGNOSIS — Z8701 Personal history of pneumonia (recurrent): Secondary | ICD-10-CM

## 2020-04-07 DIAGNOSIS — M5136 Other intervertebral disc degeneration, lumbar region: Secondary | ICD-10-CM

## 2020-04-08 ENCOUNTER — Telehealth: Payer: Self-pay | Admitting: Rheumatology

## 2020-04-08 NOTE — Telephone Encounter (Signed)
Patient called stating she saw in her Mychart the referral Dr. Corliss Skains was sending to the Immunology Department at [facility] and wants to make sure it includes that she wants to see Dr. Sherryll Burger.

## 2020-04-09 ENCOUNTER — Other Ambulatory Visit: Payer: Self-pay | Admitting: Rheumatology

## 2020-04-09 DIAGNOSIS — I7300 Raynaud's syndrome without gangrene: Secondary | ICD-10-CM

## 2020-04-09 NOTE — Telephone Encounter (Signed)
Last Visit: 04/07/2020 Next Visit: Return if symptoms worsen or fail to improve, for Scleroderma  Okay to refill Amlodipine?

## 2020-04-09 NOTE — Telephone Encounter (Signed)
Attempted to call patient, left voicemail asking patient to please return our call to confirm she is still taking Amlodipine.

## 2020-04-09 NOTE — Telephone Encounter (Signed)
Please call the patient to clarify if she is still taking amlodipine.

## 2020-04-10 NOTE — Telephone Encounter (Signed)
Spoke with patient and patient states she would like to start taking amlodipine again with the cold weather coming soon. Patient states she has been trying natural supplements and those are not working adequately. Please advise on refill.

## 2020-04-22 ENCOUNTER — Telehealth: Payer: Self-pay

## 2020-04-22 DIAGNOSIS — M349 Systemic sclerosis, unspecified: Secondary | ICD-10-CM

## 2020-04-22 NOTE — Telephone Encounter (Signed)
April from [facility] called stating patient is scheduled for an echocardiogram tomorrow 04/23/20.  April states patient has been seen in their office for testing only in the past.  April states per patient Dr. Corliss Skains wanted her to establish care with one of their physicians and requesting a referral to be sent ASAP.  If you have questions, please call back at #[phone]

## 2020-04-22 NOTE — Progress Notes (Signed)
Rescheduled

## 2020-04-22 NOTE — Telephone Encounter (Signed)
Referral placed.

## 2020-04-23 ENCOUNTER — Ambulatory Visit: Payer: BC Managed Care – PPO | Admitting: Cardiology

## 2020-04-24 ENCOUNTER — Telehealth: Payer: Self-pay

## 2020-04-24 ENCOUNTER — Encounter: Payer: Self-pay | Admitting: Cardiology

## 2020-04-24 DIAGNOSIS — M349 Systemic sclerosis, unspecified: Secondary | ICD-10-CM

## 2020-04-24 NOTE — Telephone Encounter (Signed)
Referral placed. Left message to advise patient referral placed.

## 2020-04-24 NOTE — Telephone Encounter (Signed)
Patient called requesting a referral to another cardiologist for her echocardiogram.   Patient states "every time she tries to schedule an appointment with [facility] there is a problem and is tired of it."  Patient states "a couple of years ago Dr. Corliss Skains recommended another Cardiologist" and is requesting to be referred there.   Please advise.

## 2020-04-24 NOTE — Telephone Encounter (Signed)
Please refer the patient to [facility].

## 2020-05-07 ENCOUNTER — Ambulatory Visit: Payer: BC Managed Care – PPO | Admitting: Cardiology

## 2020-05-09 ENCOUNTER — Ambulatory Visit: Payer: BC Managed Care – PPO | Admitting: Pulmonary Disease

## 2020-05-09 ENCOUNTER — Ambulatory Visit: Payer: BC Managed Care – PPO | Admitting: Adult Health

## 2020-05-13 ENCOUNTER — Ambulatory Visit: Payer: BC Managed Care – PPO | Admitting: Gastroenterology

## 2020-05-20 NOTE — Progress Notes (Signed)
Cardiology Office Note    Date:  05/21/2020   ID:  Paula [name], DOB May 10, 1961, MRN [id number]  PCP:  Jones Bales, MD  Cardiologist:  Armanda Magic, MD   Chief Complaint  Patient presents with  . New Patient (Initial Visit)    Assess for pulmonary HTN with hx of systemic sclerosis and CREST syndrome    History of Present Illness:  Paula [name] is a 59 y.o. female who is being seen today for the evaluation of pulmonary HTN with hx of systemic sclerosis and mild AS by echo at the request of Pollyann Savoy, MD.  This is a 59yo female with a hx of scleroderma with CREST syndrome followed by Rheumatology.  She is referred to get an echo to assess for pulmonary HTN.  She had an echo a year ago showing normal LVF with very mild AS.  She is here today for followup.  She denies any chest pain or pressure, SOB, DOE, PND, orthopnea, LE edema (except when eating too much Na or taking amlodipine), dizziness, palpitations or syncope. She has problems with Raynaud's and only takes amlodipine as needed for it and also some NTG gel.  She is compliant with her meds and is tolerating meds with no SE.    Past Medical History:  Diagnosis Date  . Anxiety   . Arthritis   . CREST syndrome (HCC)   . Diverticulosis   . Gastric ulcer   . Hypercholesterolemia   . Lyme disease   . Thyroid disease   . Vitamin B deficiency     Past Surgical History:  Procedure Laterality Date  . KNEE ARTHROPLASTY    . KNEE SURGERY  2007  . OOPHORECTOMY  2004  . SHOULDER SURGERY  03/2012  . TOTAL SHOULDER ARTHROPLASTY      Current Medications: Current Meds  Medication Sig  . amLODipine (NORVASC) 5 MG tablet TAKE 1 TABLET BY MOUTH DAILY (Patient taking differently: Take 5 mg by mouth as needed. )  . Ascorbic Acid (VITAMIN C) 1000 MG tablet Take 2,000 mg by mouth daily.   . Cholecalciferol (VITAMIN D PO) Take 7,500 Units by mouth daily.   . fluconazole (DIFLUCAN) 100 MG tablet Take 100 mg by mouth 3  (three) times a week.   . levothyroxine (SYNTHROID) 88 MCG tablet Take 1 tablet by mouth daily.   Marland Kitchen liothyronine (CYTOMEL) 25 MCG tablet Take 25 mcg by mouth daily.   . Menaquinone-7 (VITAMIN K2 PO) Take 100 mcg by mouth daily.   . minocycline (MINOCIN,DYNACIN) 100 MG capsule 200 mg every other day.  Marland Kitchen NALTREXONE HCL PO Take 4.5 mg by mouth daily.  Marland Kitchen NITRO-BID 2 % ointment APPLY 0.25 INCHES EXTERNALLY TO THE AFFECTED AREA THREE TIMES DAILY (Patient taking differently: as needed. )  . omeprazole (PRILOSEC) 20 MG capsule Take 20 mg by mouth daily.  Marland Kitchen OVER THE COUNTER MEDICATION 12.5 mg daily.  Marland Kitchen OVER THE COUNTER MEDICATION   . OVER THE COUNTER MEDICATION daily.  Marland Kitchen PRESCRIPTION MEDICATION Compounded estrogen cream daily  . Probiotic Product (PROBIOTIC PO) Take by mouth daily.  . progesterone (PROMETRIUM) 100 MG capsule Take 100 mg by mouth daily.   . valACYclovir (VALTREX) 500 MG tablet Take 500 mg by mouth 2 (two) times daily.  . vitamin E 400 UNIT capsule Take 800 Units by mouth daily.    Allergies:   Celebrex [celecoxib] and Vioxx [rofecoxib]   Social History   Socioeconomic History  . Marital status: Married  Spouse name: Not on file  . Number of children: 2  . Years of education: Not on file  . Highest education level: Not on file  Occupational History  . Occupation: retired  Tobacco Use  . Smoking status: Former Smoker    Types: Cigarettes  . Smokeless tobacco: Never Used  . Tobacco comment: college  Vaping Use  . Vaping Use: Never used  Substance and Sexual Activity  . Alcohol use: Yes    Comment: occ  . Drug use: No  . Sexual activity: Not on file  Other Topics Concern  . Not on file  Social History Narrative   ** Merged History Encounter **       Social Determinants of Health   Financial Resource Strain:   . Difficulty of Paying Living Expenses: Not on file  Food Insecurity:   . Worried About Programme researcher, broadcasting/film/video in the Last Year: Not on file  . Ran Out of  Food in the Last Year: Not on file  Transportation Needs:   . Lack of Transportation (Medical): Not on file  . Lack of Transportation (Non-Medical): Not on file  Physical Activity:   . Days of Exercise per Week: Not on file  . Minutes of Exercise per Session: Not on file  Stress:   . Feeling of Stress : Not on file  Social Connections:   . Frequency of Communication with Friends and Family: Not on file  . Frequency of Social Gatherings with Friends and Family: Not on file  . Attends Religious Services: Not on file  . Active Member of Clubs or Organizations: Not on file  . Attends Banker Meetings: Not on file  . Marital Status: Not on file     Family History:  The patient's family history includes Diabetes in her sister; Healthy in her daughter and son; Heart attack in her father; Kidney disease in her father.   ROS:   Please see the history of present illness.    ROS All other systems reviewed and are negative.  No flowsheet data found.     PHYSICAL EXAM:   VS:  BP (!) 144/82   Pulse 75   Ht 5\' 7"  (1.702 m)   Wt 211 lb (95.7 kg)   BMI 33.05 kg/m    GEN: Well nourished, well developed, in no acute distress  HEENT: normal  Neck: no JVD, carotid bruits, or masses Cardiac: RRR; no murmurs, rubs, or gallops,no edema.  Intact distal pulses bilaterally.  Respiratory:  clear to auscultation bilaterally, normal work of breathing GI: soft, nontender, nondistended, + BS MS: no deformity or atrophy  Skin: warm and dry, no rash Neuro:  Alert and Oriented x 3, Strength and sensation are intact Psych: euthymic mood, full affect  Wt Readings from Last 3 Encounters:  05/21/20 211 lb (95.7 kg)  04/07/20 218 lb 3.2 oz (99 kg)  11/12/19 215 lb 8 oz (97.8 kg)    Studies/Labs Reviewed:   EKG:  EKG is ordered today.  The ekg ordered today demonstrates NSR with no ST changes  Recent Labs: No results found for requested labs within last 8760 hours.   Lipid Panel No  results found for: CHOL, TRIG, HDL, CHOLHDL, VLDL, LDLCALC, LDLDIRECT   Additional studies/ records that were reviewed today include:  2D echo 2020    ASSESSMENT:    1. Nonrheumatic aortic valve stenosis   2. Systemic sclerosis (HCC)      PLAN:  In order of problems  listed above:  1.  Aortic stenosis -noted on echo in 2020 and very mild with mean AVG -she is asypmtomatic  2.  Systemic sclerosis with CREST syndrome -repeat 2D echo to assess for pulmonary HTN -she has Raynaud's disease and takes amlodipine PRN and topical NTG PRN    Medication Adjustments/Labs and Tests Ordered: Current medicines are reviewed at length with the patient today.  Concerns regarding medicines are outlined above.  Medication changes, Labs and Tests ordered today are listed in the Patient Instructions below.  There are no Patient Instructions on file for this visit.   Signed, Armanda Magic, MD  05/21/2020 10:20 AM    [facility] [address] [place], [place], [place]  [address] Phone: [phone]; Fax: [phone]

## 2020-05-21 ENCOUNTER — Other Ambulatory Visit: Payer: Self-pay

## 2020-05-21 ENCOUNTER — Ambulatory Visit (INDEPENDENT_AMBULATORY_CARE_PROVIDER_SITE_OTHER): Payer: BC Managed Care – PPO | Admitting: Cardiology

## 2020-05-21 ENCOUNTER — Encounter: Payer: Self-pay | Admitting: Cardiology

## 2020-05-21 VITALS — BP 144/82 | HR 75 | Ht 67.0 in | Wt 211.0 lb

## 2020-05-21 DIAGNOSIS — M349 Systemic sclerosis, unspecified: Secondary | ICD-10-CM

## 2020-05-21 DIAGNOSIS — I350 Nonrheumatic aortic (valve) stenosis: Secondary | ICD-10-CM | POA: Diagnosis not present

## 2020-05-21 NOTE — Addendum Note (Signed)
Addended by: Theresia Majors on: 05/21/2020 10:32 AM   Modules accepted: Orders

## 2020-05-21 NOTE — Patient Instructions (Signed)

## 2020-05-28 ENCOUNTER — Other Ambulatory Visit: Payer: Self-pay | Admitting: *Deleted

## 2020-05-28 DIAGNOSIS — I272 Pulmonary hypertension, unspecified: Secondary | ICD-10-CM

## 2020-05-29 ENCOUNTER — Telehealth: Payer: Self-pay

## 2020-05-29 ENCOUNTER — Ambulatory Visit: Payer: BC Managed Care – PPO | Admitting: Pulmonary Disease

## 2020-05-29 ENCOUNTER — Ambulatory Visit (INDEPENDENT_AMBULATORY_CARE_PROVIDER_SITE_OTHER): Payer: BC Managed Care – PPO | Admitting: Pulmonary Disease

## 2020-05-29 ENCOUNTER — Encounter: Payer: Self-pay | Admitting: Pulmonary Disease

## 2020-05-29 ENCOUNTER — Ambulatory Visit (INDEPENDENT_AMBULATORY_CARE_PROVIDER_SITE_OTHER): Payer: BC Managed Care – PPO

## 2020-05-29 ENCOUNTER — Other Ambulatory Visit: Payer: Self-pay

## 2020-05-29 VITALS — BP 128/80 | HR 91 | Temp 97.0°F | Ht 67.0 in | Wt 211.0 lb

## 2020-05-29 DIAGNOSIS — I272 Pulmonary hypertension, unspecified: Secondary | ICD-10-CM

## 2020-05-29 DIAGNOSIS — Z0000 Encounter for general adult medical examination without abnormal findings: Secondary | ICD-10-CM

## 2020-05-29 DIAGNOSIS — M349 Systemic sclerosis, unspecified: Secondary | ICD-10-CM

## 2020-05-29 LAB — PULMONARY FUNCTION TEST
DL/VA % pred: 139 %
DL/VA: 5.73 ml/min/mmHg/L
DLCO unc % pred: 147 %
DLCO unc: 32.86 ml/min/mmHg
FEF 25-75 Post: 5.15 L/sec
FEF 25-75 Pre: 4.03 L/sec
FEF2575-%Change-Post: 27 %
FEF2575-%Pred-Post: 203 %
FEF2575-%Pred-Pre: 159 %
FEV1-%Change-Post: 0 %
FEV1-%Pred-Post: 115 %
FEV1-%Pred-Pre: 114 %
FEV1-Post: 3.29 L
FEV1-Pre: 3.26 L
FEV1FVC-%Change-Post: 3 %
FEV1FVC-%Pred-Pre: 111 %
FEV6-%Change-Post: -2 %
FEV6-%Pred-Post: 102 %
FEV6-%Pred-Pre: 105 %
FEV6-Post: 3.66 L
FEV6-Pre: 3.75 L
FEV6FVC-%Pred-Post: 103 %
FEV6FVC-%Pred-Pre: 103 %
FVC-%Change-Post: -2 %
FVC-%Pred-Post: 99 %
FVC-%Pred-Pre: 101 %
FVC-Post: 3.66 L
FVC-Pre: 3.75 L
Post FEV1/FVC ratio: 90 %
Post FEV6/FVC ratio: 100 %
Pre FEV1/FVC ratio: 87 %
Pre FEV6/FVC Ratio: 100 %
RV % pred: 122 %
RV: 2.61 L
TLC % pred: 116 %
TLC: 6.39 L

## 2020-05-29 NOTE — Progress Notes (Signed)
@Patient  ID: Paula [name], female    DOB: 05/01/1961, 59 y.o.   MRN: [id number]  Chief Complaint  Patient presents with  . Follow-up    Pulm Hypertension    Referring provider: Jones BalesLantelme, Bruce E, MD  HPI:  59 year old female former smoker initially referred to our office for evaluation of suspected pulmonary hypertension or ILD in the setting of systemic sclerosis  PMH: Systemic sclerosis, history of diverticulosis, history of Epstein-Barr virus infection, history of hypothyroidism Smoker/ Smoking History: Former smoker Maintenance: None Pt of: Dr. Kendrick FriesMcQuaid  05/29/2020  - Visit   59 year old female former smoker initially referred to our office for evaluation of suspected pulmonary hypertension or interstitial lung disease in the setting of systemic sclerosis.  She establish care with Dr. Kendrick FriesMcQuaid in 2019.  At that time pulmonary function testing was stable.  She was encouraged to follow-up with cardiology and keep follow-up with rheumatology.  She has since continued to have surveillance echocardiograms and pulmonary function testing done annually.  She was previously established with Dr. Rosemary HolmsPatwardhan office for cardiology.  She is recently established with Dr. Mayford Knifeurner as of this month.  She was last seen by rheumatology in September/2021 Dr. Corliss Skainseveshwar.  See that assessment and plan listed below: Assessment / Plan:     Visit Diagnoses: Limited systemic sclerosis (HCC) - Sclerodactyly, nail fold capillary changes, raynaud's, fatigue, few telangiectasias, ANA+, anticentromere+, RF+.  Patient notices some increased tightness in her skin.  Mild sclerodactyly was noted.  Nailbed capillary changes were noted.  [facility] see if more aggressive therapy is available for her.  I reviewed her chart and reviewed the records from the pulmonologist and the cardiologist.  She will need repeat PFTs and echocardiogram.  Raynaud's disease without gangrene - patient takes  amlodipine on PRN basis.    Rash - hyperpigmented rough patch was noted on her back.  Patient states she was seen by dermatologist and was given the diagnosis of morphea..  Primary osteoarthritis of both hands-she has severe DIP thickening consistent with osteoarthritis.  Chondromalacia of patella, unspecified laterality-chronic discomfort.  Primary osteoarthritis of both feet-chronic pain.  DDD (degenerative disc disease), cervical - goes to chiropractor.  DDD (degenerative disc disease), lumbar - goes to chiropractor.  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  She would like to be [facility].  We will make the referral per her request.  History of diverticulosis  History of mycoplasma pneumonia  Patient is also establish care with Dr. Mayford Knifeurner for cardiology.  See the assessment and plan from 05/21/2020 office visit: 1.  Aortic stenosis -noted on echo in 2020 and very mild with mean AVG 8mmHg -she is asypmtomatic  2.  Systemic sclerosis with CREST syndrome -repeat 2D echo to assess for pulmonary HTN -she has Raynaud's disease and takes amlodipine PRN and topical NTG PRN  She also reports that she has been seen by functional medicine [facility].  She reports that she started ozone treatments with them but she stopped these due to a reaction.  She remains unvaccinated COVID-19 as well as the seasonal flu vaccine.   Patient presenting today after completing pulmonary function testing those results are listed below:  05/29/2020-pulmonary function test-FVC 3.75 (101% predicted), postbronchodilator ratio 90, postbronchodilator FEV1 3.29 (115% predicted), no bronchodilator response, TLC 6.39 (116% predicted), DLCO 32.86 (147% predicted)  Patient reports that she is asymptomatic to any  sort of respiratory complaints today.  She denies  cough or shortness of breath.   Questionaires / Pulmonary Flowsheets:   ACT:  No flowsheet data found.  MMRC: mMRC Dyspnea Scale mMRC Score  05/29/2020 0    Epworth:  No flowsheet data found.  Tests:   FENO:  No results found for: NITRICOXIDE  PFT: PFT Results Latest Ref Rng & Units 04/04/2019 01/05/2018  FVC-Pre L 3.75 3.75  FVC-Predicted Pre % 101 100  Pre FEV1/FVC % % 87 88  FEV1-Pre L 3.25 3.29  FEV1-Predicted Pre % 113 113  DLCO uncorrected ml/min/mmHg 34.14 32.59  DLCO UNC% % 152 115  DLVA Predicted % 149 111  TLC L 5.94 6.62  TLC % Predicted % 107 120  RV % Predicted % 102 121    WALK:  No flowsheet data found.  Imaging: No results found.  Lab Results:  CBC    Component Value Date/Time   WBC 4.5 04/05/2018 1505   RBC 4.52 04/05/2018 1505   HGB 13.7 04/05/2018 1505   HCT 40.9 04/05/2018 1505   PLT 232 04/05/2018 1505   MCV 90.5 04/05/2018 1505   MCH 30.3 04/05/2018 1505   MCHC 33.5 04/05/2018 1505   RDW 12.6 04/05/2018 1505   LYMPHSABS 869 04/05/2018 1505   MONOABS 0.6 03/09/2017 1516   EOSABS 162 04/05/2018 1505   BASOSABS 18 04/05/2018 1505    BMET    Component Value Date/Time   NA 141 04/05/2018 1505   K 5.3 04/05/2018 1505   CL 109 04/05/2018 1505   CO2 23 04/05/2018 1505   GLUCOSE 127 (H) 04/05/2018 1505   BUN 23 04/05/2018 1505   CREATININE 0.78 04/05/2018 1505   CALCIUM 10.9 (H) 04/05/2018 1505   GFRNONAA 84 04/05/2018 1505   GFRAA 97 04/05/2018 1505    BNP No results found for: BNP  ProBNP No results found for: PROBNP  Specialty Problems    None      Allergies  Allergen Reactions  . Celebrex [Celecoxib] Swelling  . Vioxx [Rofecoxib]     Immunization History  Administered Date(s) Administered  . Influenza,inj,Quad PF,6+ Mos 06/24/2014  . Tdap 04/04/2016    Past Medical History:  Diagnosis Date  . Anxiety   . Arthritis   . CREST syndrome (HCC)   . Diverticulosis   . Gastric ulcer   .  Hypercholesterolemia   . Lyme disease   . Thyroid disease   . Vitamin B deficiency     Tobacco History: Social History   Tobacco Use  Smoking Status Former Smoker  . Types: Cigarettes  Smokeless Tobacco Never Used  Tobacco Comment   college   Counseling given: Not Answered Comment: college   Continue to not smoke  Outpatient Encounter Medications as of 05/29/2020  Medication Sig  . amLODipine (NORVASC) 5 MG tablet TAKE 1 TABLET BY MOUTH DAILY (Patient taking differently: Take 5 mg by mouth as needed. )  . Ascorbic Acid (VITAMIN C) 1000 MG tablet Take 2,000 mg by mouth daily.   . Cholecalciferol (VITAMIN D PO) Take 7,500 Units by mouth daily.   . fluconazole (DIFLUCAN) 100 MG tablet Take 100 mg by mouth 3 (three) times a week.   . levothyroxine (SYNTHROID) 88 MCG tablet Take 1 tablet by mouth daily.   Marland Kitchen liothyronine (CYTOMEL) 25 MCG tablet Take 25 mcg by mouth daily.   . Menaquinone-7 (VITAMIN K2 PO) Take 100 mcg by mouth daily.   . minocycline (MINOCIN,DYNACIN) 100 MG capsule 200 mg  every other day.  Marland Kitchen NALTREXONE HCL PO Take 4.5 mg by mouth daily.  Marland Kitchen NITRO-BID 2 % ointment APPLY 0.25 INCHES EXTERNALLY TO THE AFFECTED AREA THREE TIMES DAILY (Patient taking differently: as needed. )  . omeprazole (PRILOSEC) 20 MG capsule Take 20 mg by mouth daily.  Marland Kitchen OVER THE COUNTER MEDICATION 12.5 mg daily.  Marland Kitchen OVER THE COUNTER MEDICATION   . OVER THE COUNTER MEDICATION daily.  Marland Kitchen PRESCRIPTION MEDICATION Compounded estrogen cream daily  . Probiotic Product (PROBIOTIC PO) Take by mouth daily.  . progesterone (PROMETRIUM) 100 MG capsule Take 100 mg by mouth daily.   . valACYclovir (VALTREX) 500 MG tablet Take 500 mg by mouth 2 (two) times daily.  . vitamin E 400 UNIT capsule Take 800 Units by mouth daily.   No facility-administered encounter medications on file as of 05/29/2020.     Review of Systems  Review of Systems  Constitutional: Negative for activity change, fatigue and fever.    HENT: Negative for sinus pressure, sinus pain and sore throat.   Respiratory: Negative for cough, chest tightness, shortness of breath and wheezing.   Cardiovascular: Negative for chest pain and palpitations.  Gastrointestinal: Negative for diarrhea, nausea and vomiting.  Musculoskeletal: Negative for arthralgias.  Neurological: Negative for dizziness.  Psychiatric/Behavioral: Negative for sleep disturbance. The patient is not nervous/anxious.      Physical Exam  BP 128/80 (BP Location: Left Arm, Cuff Size: Normal)   Pulse 91   Temp (!) 97 F (36.1 C) (Oral)   Ht 5\' 7"  (1.702 m)   Wt 211 lb (95.7 kg)   SpO2 100%   BMI 33.05 kg/m   Wt Readings from Last 5 Encounters:  05/29/20 211 lb (95.7 kg)  05/21/20 211 lb (95.7 kg)  04/07/20 218 lb 3.2 oz (99 kg)  11/12/19 215 lb 8 oz (97.8 kg)  10/08/19 216 lb (98 kg)    BMI Readings from Last 5 Encounters:  05/29/20 33.05 kg/m  05/21/20 33.05 kg/m  04/07/20 34.17 kg/m  11/12/19 34.47 kg/m  10/08/19 33.83 kg/m     Physical Exam Vitals and nursing note reviewed.  Constitutional:      General: She is not in acute distress.    Appearance: Normal appearance. She is normal weight.  HENT:     Head: Normocephalic and atraumatic.     Right Ear: External ear normal.     Left Ear: External ear normal.     Nose: Nose normal.  Eyes:     Pupils: Pupils are equal, round, and reactive to light.  Cardiovascular:     Rate and Rhythm: Normal rate and regular rhythm.     Pulses: Normal pulses.     Heart sounds: Normal heart sounds. No murmur heard.   Pulmonary:     Effort: Pulmonary effort is normal. No respiratory distress.     Breath sounds: Normal breath sounds. No decreased air movement. No decreased breath sounds, wheezing or rales.  Musculoskeletal:     Cervical back: Normal range of motion.  Skin:    General: Skin is warm and dry.     Capillary Refill: Capillary refill takes less than 2 seconds.  Neurological:      General: No focal deficit present.     Mental Status: She is alert and oriented to person, place, and time. Mental status is at baseline.     Gait: Gait normal.  Psychiatric:        Mood and Affect: Mood normal.  Behavior: Behavior normal.        Thought Content: Thought content normal.        Judgment: Judgment normal.       Assessment & Plan:   Systemic sclerosis (HCC) Plan: Continue follow-up with rheumatology Complete echocardiogram as planned by cardiology We will have you establish with one of our ILD specialist in a 30-minute time slot sometime over the next 3 to 6 months Can consider repeating pulmonary function testing in 1 year or sooner based off symptoms Chest x-ray today   Healthcare maintenance Plan: Recommend seasonal flu vaccine and COVID-19 vaccinations  We can respect your personal decision to not receive these vaccinations.    Return in about 3 months (around 08/29/2020), or if symptoms worsen or fail to improve, for Follow up with Dr. Isaiah Serge, NEW PULMONOLOGIST IN SLOT.   Coral Ceo, NP 05/29/2020   This appointment required 32 minutes of patient care (this includes precharting, chart review, review of results, face-to-face care, etc.).

## 2020-05-29 NOTE — Assessment & Plan Note (Signed)
Plan: Continue follow-up with rheumatology Complete echocardiogram as planned by cardiology We will have you establish with one of our ILD specialist in a 30-minute time slot sometime over the next 3 to 6 months Can consider repeating pulmonary function testing in 1 year or sooner based off symptoms Chest x-ray today

## 2020-05-29 NOTE — Patient Instructions (Addendum)
You were seen today by Coral Ceo, NP  for:   1. Systemic sclerosis (HCC)  - DG Chest 2 View; Future  Chest x-ray today  Continue follow-up with cardiology  Continue follow-up with rheumatology  2. Healthcare maintenance  We would recommend your COVID-19 vaccinations, you declined this today  We recommend the seasonal flu vaccine, you declined this today   We recommend today:  Orders Placed This Encounter  Procedures  . DG Chest 2 View    Standing Status:   Future    Standing Expiration Date:   09/26/2020    Order Specific Question:   Reason for Exam (SYMPTOM  OR DIAGNOSIS REQUIRED)    Answer:   suspected ild, known ctd    Order Specific Question:   Preferred imaging location?    Answer:   Internal    Order Specific Question:   Radiology Contrast Protocol - do NOT remove file path    Answer:   [email or web address]   Orders Placed This Encounter  Procedures  . DG Chest 2 View   No orders of the defined types were placed in this encounter.   Follow Up:    Return in about 3 months (around 08/29/2020), or if symptoms worsen or fail to improve, for Follow up with Dr. Isaiah Serge, NEW PULMONOLOGIST IN SLOT. Former BQ patient, needs to establish with either Dr. Isaiah Serge or Dr. Marchelle Gearing in a 30-minute time slot  Notification of test results are managed in the following manner: If there are  any recommendations or changes to the  plan of care discussed in office today,  we will contact you and let you know what they are. If you do not hear from [place], then your results are normal and you can view them through your  MyChart account , or a letter will be sent to you. Thank you again for trusting [place] with your care  - Thank you, [facility]    It is flu season:   >>> Best ways to protect herself from the flu: Receive the yearly flu vaccine, practice good hand hygiene washing with soap and also using hand sanitizer when  available, eat a nutritious meals, get adequate rest, hydrate appropriately       Please contact the office if your symptoms worsen or you have concerns that you are not improving.   Thank you for choosing [facility] for your healthcare, and for allowing [place] to partner with you on your healthcare journey. I am thankful to be able to provide care to you today.   Elisha Headland FNP-C

## 2020-05-29 NOTE — Telephone Encounter (Signed)
Patient left a voicemail stating she is having "some issues" and would like to schedule an appointment with Dr. Corliss Skains.  Patient states Dr. Corliss Skains referred her to [facility], but they can't see her until January.  Patient requested a return call.

## 2020-05-29 NOTE — Assessment & Plan Note (Signed)
Plan: Recommend seasonal flu vaccine and COVID-19 vaccinations  We can respect your personal decision to not receive these vaccinations.

## 2020-05-30 ENCOUNTER — Encounter: Payer: Self-pay | Admitting: *Deleted

## 2020-05-30 NOTE — Telephone Encounter (Signed)
I could not reach the patient on phone.  Please advise patient that it is not advised to give steroids in patients with the diagnosis of scleroderma.  Prednisone can induce malignant hypertension and kidney damage.

## 2020-05-30 NOTE — Telephone Encounter (Signed)
Attempted to contact the patient and left message for patient to call the office.  

## 2020-05-30 NOTE — Telephone Encounter (Signed)
Patient states she is having a flare of her scleroderma. Patient states she and her functional medicine doctot  have tried some things to help with the Epstein-Barr virus infection and boosted her immune system. Patient states since then she is having a flare. Patient was asking about a short course of steroids to help with the flare as she does not see [facility] until January. Please advise.

## 2020-06-02 NOTE — Telephone Encounter (Signed)
Attempted to contact the patient and left message for patient to call the office.  

## 2020-06-03 ENCOUNTER — Telehealth: Payer: Self-pay | Admitting: Pulmonary Disease

## 2020-06-03 ENCOUNTER — Telehealth: Payer: Self-pay

## 2020-06-03 NOTE — Telephone Encounter (Signed)
Patient called stating she was returning Andrea's call.  Patient was told she would receive a return call in 24-48 hours. 

## 2020-06-03 NOTE — Telephone Encounter (Signed)
LMOM for patient to return call regarding Dr. Deveshwar's suggestion. °

## 2020-06-03 NOTE — Telephone Encounter (Signed)
Called and spoke with pt asking if she would like PFT results to be mailed to address on file and she said that would be fine. Verified mailing address and have placed this in mail for pt. Nothing further needed.

## 2020-06-03 NOTE — Telephone Encounter (Signed)
LMOM for patient to return call regarding Dr. Fatima Sanger suggestion.

## 2020-06-17 ENCOUNTER — Ambulatory Visit (HOSPITAL_COMMUNITY): Payer: BC Managed Care – PPO | Attending: Cardiology

## 2020-06-17 DIAGNOSIS — I350 Nonrheumatic aortic (valve) stenosis: Secondary | ICD-10-CM | POA: Diagnosis not present

## 2020-06-17 DIAGNOSIS — M349 Systemic sclerosis, unspecified: Secondary | ICD-10-CM | POA: Insufficient documentation

## 2020-06-17 LAB — ECHOCARDIOGRAM COMPLETE
Area-P 1/2: 1.72 cm2
S' Lateral: 2.9 cm

## 2020-06-18 ENCOUNTER — Telehealth: Payer: Self-pay

## 2020-06-18 DIAGNOSIS — M349 Systemic sclerosis, unspecified: Secondary | ICD-10-CM

## 2020-06-18 DIAGNOSIS — I350 Nonrheumatic aortic (valve) stenosis: Secondary | ICD-10-CM

## 2020-06-18 NOTE — Telephone Encounter (Signed)
The patient has been notified of the result and verbalized understanding.  All questions (if any) were answered. Theresia Majors, RN 06/18/2020 2:38 PM

## 2020-06-18 NOTE — Telephone Encounter (Signed)
-----   Message from Quintella Reichert, MD sent at 06/18/2020 12:11 PM EST ----- Echo showed normal LVF with increased stiffness of heart muscle and mildly thickened AV.  No evidence of AS or pulmonary HTN.  Repeat echo in 1 year to follow for development of pulmonary HTN due to systemic sclerosis

## 2020-06-19 ENCOUNTER — Ambulatory Visit: Payer: BC Managed Care – PPO | Admitting: Gastroenterology

## 2020-11-26 ENCOUNTER — Ambulatory Visit: Payer: BC Managed Care – PPO | Admitting: Podiatry

## 2021-04-09 ENCOUNTER — Other Ambulatory Visit: Payer: Self-pay | Admitting: Family Medicine

## 2021-04-09 DIAGNOSIS — E042 Nontoxic multinodular goiter: Secondary | ICD-10-CM

## 2021-04-14 ENCOUNTER — Ambulatory Visit: Payer: BC Managed Care – PPO | Admitting: Gastroenterology

## 2021-04-16 ENCOUNTER — Ambulatory Visit
Admission: RE | Admit: 2021-04-16 | Discharge: 2021-04-16 | Disposition: A | Discharge status: Home / Self Care | Payer: BC Managed Care – PPO | Source: Ambulatory Visit | Attending: Family Medicine | Admitting: Family Medicine

## 2021-04-16 DIAGNOSIS — E042 Nontoxic multinodular goiter: Secondary | ICD-10-CM

## 2021-06-18 ENCOUNTER — Ambulatory Visit (HOSPITAL_COMMUNITY): Payer: BC Managed Care – PPO | Attending: Cardiology

## 2021-06-18 ENCOUNTER — Other Ambulatory Visit: Payer: Self-pay

## 2021-06-18 DIAGNOSIS — M349 Systemic sclerosis, unspecified: Secondary | ICD-10-CM | POA: Insufficient documentation

## 2021-06-18 DIAGNOSIS — I350 Nonrheumatic aortic (valve) stenosis: Secondary | ICD-10-CM | POA: Insufficient documentation

## 2021-06-19 ENCOUNTER — Telehealth: Payer: Self-pay | Admitting: Cardiology

## 2021-06-19 ENCOUNTER — Encounter: Payer: Self-pay | Admitting: Cardiology

## 2021-06-19 NOTE — Telephone Encounter (Signed)
LM TO CALL BACK ./cy 

## 2021-06-19 NOTE — Telephone Encounter (Signed)
Patient returned call for echo results.  

## 2021-06-21 LAB — ECHOCARDIOGRAM COMPLETE
AR max vel: 2.79 cm2
AV Area VTI: 3.25 cm2
AV Area mean vel: 2.9 cm2
AV Mean grad: 8 mmHg
AV Peak grad: 14.6 mmHg
Ao pk vel: 1.91 m/s
Area-P 1/2: 3.17 cm2
S' Lateral: 2.7 cm

## 2021-06-22 NOTE — Telephone Encounter (Signed)
Pt aware of echo results ./cy 

## 2021-07-08 ENCOUNTER — Ambulatory Visit: Payer: BC Managed Care – PPO | Admitting: Podiatry

## 2021-08-31 ENCOUNTER — Ambulatory Visit (INDEPENDENT_AMBULATORY_CARE_PROVIDER_SITE_OTHER): Payer: BC Managed Care – PPO | Admitting: Podiatry

## 2021-08-31 ENCOUNTER — Ambulatory Visit (INDEPENDENT_AMBULATORY_CARE_PROVIDER_SITE_OTHER): Payer: BC Managed Care – PPO

## 2021-08-31 ENCOUNTER — Other Ambulatory Visit: Payer: Self-pay

## 2021-08-31 DIAGNOSIS — M67472 Ganglion, left ankle and foot: Secondary | ICD-10-CM

## 2021-08-31 DIAGNOSIS — M205X1 Other deformities of toe(s) (acquired), right foot: Secondary | ICD-10-CM

## 2021-08-31 DIAGNOSIS — M6740 Ganglion, unspecified site: Secondary | ICD-10-CM

## 2021-08-31 MED ORDER — BETAMETHASONE SOD PHOS & ACET 6 (3-3) MG/ML IJ SUSP: 3.0000 mg | Freq: Once | INTRAMUSCULAR | Status: AC

## 2021-08-31 NOTE — Progress Notes (Signed)
HPI: 61 y.o. female presenting today as a reestablish new patient for evaluation of limited range of motion with a bone spur to the right great toe joint.  Patient states that she has been dealing with this pain for several years.  She does have a history of trauma to this area several years prior.  She says it is not necessarily painful however it is limiting some of her shoe gear and activities. Patient also states that over the past few weeks she developed a lump to the dorsum of the left foot.  She denies a history of injury.  Idiopathic onset.  She believes it is possibly ganglion cyst  Past Medical History:  Diagnosis Date   Anxiety    Arthritis    CREST syndrome (HCC)    Diverticulosis    Gastric ulcer    Hypercholesterolemia    Lyme disease    Thyroid disease    Vitamin B deficiency     Past Surgical History:  Procedure Laterality Date   KNEE ARTHROPLASTY     KNEE SURGERY  2007   OOPHORECTOMY  2004   SHOULDER SURGERY  03/2012   TOTAL SHOULDER ARTHROPLASTY      Allergies  Allergen Reactions   Celebrex [Celecoxib] Swelling   Vioxx [Rofecoxib]      Physical Exam: General: The patient is alert and oriented x3 in no acute distress.  Dermatology: Skin is warm, dry and supple bilateral lower extremities. Negative for open lesions or macerations.  Vascular: Palpable pedal pulses bilaterally. Capillary refill within normal limits.  Negative for any significant edema or erythema  Neurological: Light touch and protective threshold grossly intact  Musculoskeletal Exam: Limited range of motion with periarticular spurring prominence noted to the right first MTP joint consistent with a hallux limitus/DJD.  There is also a knot adhered fluctuant mass noted to the dorsum of the left foot consistent with a ganglion cyst  Radiographic Exam:  Normal osseous mineralization.  Advanced degenerative changes with peritubular spurring noted to the first MTP joint right.     Assessment: 1.  Hallux limitus with periarticular spurring right 2.  Ganglion cyst left   Plan of Care:  1. Patient evaluated. X-Rays reviewed.  2.  Pressure was applied to the ganglion cyst and it was ruptured and resorbed into the surrounding soft tissue.  Injection of 0.5 cc Celestone Soluspan injected along the dorsum of the foot to reduce any inflammation throughout the area 3.  In regards to the hallux limitus with bone spurring of the right foot, we did discuss different surgical options including simple cheilectomy versus arthroplasty with implant.  I do believe an arthroplasty with implant would be better suited for the patient.  Advantages and disadvantages of each were discussed.  Fortunately the patient does not have much associated pain to the area so she will go home and think about it for now. 4.  Return to clinic as needed for surgical consult  *I did a first MTP implant to her husband, Mr. O'Hare Art gallery manager.       Felecia Shelling, DPM [facility]  Dr. Felecia Shelling, DPM    [address], [place] [address]  Office [phone]  Fax [phone]

## 2022-01-12 ENCOUNTER — Ambulatory Visit (INDEPENDENT_AMBULATORY_CARE_PROVIDER_SITE_OTHER): Payer: BC Managed Care – PPO | Admitting: Allergy and Immunology

## 2022-01-12 ENCOUNTER — Encounter: Payer: Self-pay | Admitting: Allergy and Immunology

## 2022-01-12 VITALS — BP 140/80 | HR 90 | Temp 97.9°F | Resp 18 | Ht 67.0 in | Wt 205.0 lb

## 2022-01-12 DIAGNOSIS — B999 Unspecified infectious disease: Secondary | ICD-10-CM

## 2022-01-12 DIAGNOSIS — M6281 Muscle weakness (generalized): Secondary | ICD-10-CM | POA: Diagnosis not present

## 2022-01-12 DIAGNOSIS — M341 CR(E)ST syndrome: Secondary | ICD-10-CM

## 2022-01-12 DIAGNOSIS — R5383 Other fatigue: Secondary | ICD-10-CM | POA: Diagnosis not present

## 2022-01-12 NOTE — Progress Notes (Addendum)
[place] - [place] - [place] - [place]   Dear Dr. Lelon Huh,  Thank you for referring Paula [name] to the [facility] [place] on 01/12/2022.   Below is a summation of this patient's evaluation and recommendations.  Thank you for your referral. I will keep you informed about this patient's response to treatment.   If you have any questions please do not hesitate to contact me.   Sincerely,  Paula Priest, MD Allergy / Immunology [facility]   ______________________________________________________________________    NEW PATIENT NOTE  Referring Provider: Jones Bales, MD Primary Provider: Jones Bales, MD Date of office visit: 01/12/2022    Subjective:   Chief Complaint:  Paula [name] (DOB: February 11, 1961) is a 61 y.o. female who presents to the clinic on 01/12/2022 with a chief complaint of Immunology (Says she began having issues back in 2016. January 2017 began to get sick, mycoplasma, mold test came back positive. Epstein-Barr  and still having issue. Says she get sick often. ) .     HPI: Paula [name] presents to this clinic in evaluation of autoimmune disease.  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  Currently, the big issue for her at this point in time is overwhelming fatigue and exhaustion since 2016.  If she exerts herself to any significant amount she can end up in bed for days with muscle weakness and discomfort and fatigue.  She has noticed increasing exercise intolerance.  She currently exercises in the pool performing pool walking for 30 minutes and there are some days when she cannot complete this activity or  does not initiate this activity.  She does not have any initiation insomnia although she does wake up 3 times per night but can quickly go back to sleep.  It is these symptoms that bother her the most and she is questioning whether or not she has an adequate immune system to fight her chronic infections which may be contributing to her symptoms described above.  Her Crest and esophageal dysmotility and Raynaud's are actually under very good control at this point in time.  She is followed by rheumatology and cardiology and gastroenterology and her current therapy which basically includes a proton pump inhibitor and avoidance of cold to her extremities and she believes that she has good control of all of her issues.  Past Medical History:  Diagnosis Date   Anxiety    Arthritis    CREST syndrome (HCC)    Diverticulosis    Gastric ulcer    Hypercholesterolemia    Lyme disease    Thyroid disease    Vitamin B deficiency     Past Surgical History:  Procedure Laterality Date   KNEE ARTHROPLASTY     KNEE SURGERY  2007   OOPHORECTOMY  2004   SHOULDER SURGERY  03/2012   TOTAL SHOULDER ARTHROPLASTY      Allergies as of 01/12/2022       Reactions   Celebrex [celecoxib] Swelling   Vioxx [rofecoxib]         Medication List    Advanced Probiotic 10 Caps Take by mouth.   liothyronine 25 MCG tablet Commonly known as: CYTOMEL Take 25 mcg by mouth daily.  minocycline 100 MG capsule Commonly known as: MINOCIN 200 mg every other day.   NALTREXONE HCL PO Take 4.5 mg by mouth daily.   omeprazole 20 MG capsule Commonly known as: PRILOSEC Take 20 mg by mouth daily.   OVER THE COUNTER MEDICATION 12.5 mg daily.   OVER THE COUNTER MEDICATION   OVER THE COUNTER MEDICATION daily.   PRESCRIPTION MEDICATION Compounded estrogen cream daily   PROBIOTIC PO Take by mouth daily.   progesterone 100 MG capsule Commonly known as: PROMETRIUM Take 100 mg by mouth daily.   progesterone  100 MG capsule Commonly known as: PROMETRIUM Take 100 mg by mouth at bedtime.   Synthroid 88 MCG tablet Generic drug: levothyroxine Take 1 tablet by mouth daily.   valACYclovir 500 MG tablet Commonly known as: VALTREX Take 500 mg by mouth 2 (two) times daily.   vitamin C 1000 MG tablet Take 2,000 mg by mouth daily.   VITAMIN D PO Take 7,500 Units by mouth daily.   vitamin E 180 MG (400 UNITS) capsule Take 800 Units by mouth daily.   VITAMIN K2 PO Take 100 mcg by mouth daily.        Review of systems negative except as noted in HPI / PMHx or noted below:  Review of Systems  Constitutional: Negative.   HENT: Negative.    Eyes: Negative.   Respiratory: Negative.    Cardiovascular: Negative.   Gastrointestinal: Negative.   Genitourinary: Negative.   Musculoskeletal: Negative.   Skin: Negative.   Neurological: Negative.   Endo/Heme/Allergies: Negative.   Psychiatric/Behavioral: Negative.      Family History  Problem Relation Age of Onset   Heart attack Father    Kidney disease Father    Diabetes Sister    Healthy Daughter    Healthy Son    Colon cancer Neg Hx    Pancreatic cancer Neg Hx    Rectal cancer Neg Hx    Stomach cancer Neg Hx     Social History   Socioeconomic History   Marital status: Married    Spouse name: Not on file   Number of children: 2   Years of education: Not on file   Highest education level: Not on file  Occupational History   Occupation: retired  Tobacco Use   Smoking status: Former    Types: Cigarettes   Smokeless tobacco: Never   Tobacco comments:    college  Building services engineer Use: Never used  Substance and Sexual Activity   Alcohol use: Yes    Comment: occ   Drug use: No   Sexual activity: Yes  Other Topics Concern   Not on file  Social History Narrative   ** Merged History Encounter **       Environmental and Social history  Lives in a house with a dry environment, a cat and dog located inside the  household, no carpet in the bedroom, no plastic on the bed, no plastic on the pillow, no smoking ongoing with inside the household.  Objective:  There were no vitals filed for this visit.      Physical Exam Constitutional:      Appearance: She is not diaphoretic.  HENT:     Head: Normocephalic.     Right Ear: Tympanic membrane, ear canal and external ear normal.     Left Ear: Tympanic membrane, ear canal and external ear normal.     Nose: Nose normal. No mucosal edema or rhinorrhea.  Mouth/Throat:     Pharynx: Uvula midline. No oropharyngeal exudate.  Eyes:     Conjunctiva/sclera: Conjunctivae normal.  Neck:     Thyroid: No thyromegaly.     Trachea: Trachea normal. No tracheal tenderness or tracheal deviation.  Cardiovascular:     Rate and Rhythm: Normal rate and regular rhythm.     Heart sounds: Normal heart sounds, S1 normal and S2 normal. No murmur heard. Pulmonary:     Effort: No respiratory distress.     Breath sounds: Normal breath sounds. No stridor. No wheezing or rales.  Lymphadenopathy:     Head:     Right side of head: No tonsillar adenopathy.     Left side of head: No tonsillar adenopathy.     Cervical: No cervical adenopathy.  Skin:    Findings: No erythema or rash.     Nails: There is no clubbing.  Neurological:     Mental Status: She is alert.     Diagnostics:   Review of lung volumes obtained 29 May 2020 identified TLC 116% predicted, RV 122% predicted, DL/VA 010% predicted.  Results of a chest x-ray obtained 29 May 2020 identified the following:  The heart size and mediastinal contours are within normal limits. Both lungs are clear. The visualized skeletal structures are unremarkable. Lung volumes normal. No significant pulmonary fibrosis.  Results of a echocardiogram obtained 19 May 2021 identified the following:   1. Left ventricular ejection fraction, by estimation, is 60 to 65%. The  left ventricle has normal function. The  left ventricle has no regional  wall motion abnormalities. Left ventricular diastolic parameters were  normal. The average left ventricular  global longitudinal strain is -20.5 %. The global longitudinal strain is  normal.   2. Right ventricular systolic function is normal. The right ventricular  size is normal. Tricuspid regurgitation signal is inadequate for assessing  PA pressure.   3. The mitral valve is normal in structure. Trivial mitral valve  regurgitation.   4. The aortic valve is tricuspid. There is mild thickening of the aortic  valve. Aortic valve regurgitation is not visualized. Aortic valve  sclerosis is present, with no evidence of aortic valve stenosis.   5. The inferior vena cava is normal in size with greater than 50%  respiratory variability, suggesting right atrial pressure of 3 mmHg.  Results of blood tests brought in by Rayfield Citizen from [facility] collected 2018-2023 identifies positive ANA with anticentromere IgG antibody greater than 240 U/mL, rheumatoid factor IgM 6.4U/mL, mycoplasma IgG 1.31U/mL, mycoplasma IgM 1107 U/mL, EBV VCA IgM 90.5 U/mL, normal thyroid function test.  Assessment and Plan:    1. Recurrent infections   2. Other fatigue   3. Exhaustion   4. Muscle weakness   5. CREST variant of scleroderma (HCC)     1.  Blood - TSH, FT4, thyroid peroxidase antibody, IgA/G/M, anti-pneumococcal ab, anti-tetanus ab. SPEP with reflex to immunofixation, CRP, SED, C3, C4, CPK, Aldolase, cortisol, CBC w/d.   2. Further evaluation? Treatment?  Gertha certainly has some form of immune dysregulation giving rise to her Crest syndrome and possibly creating some loss of immune surveillance directed at specific infectious pathogens and we will evaluate this issue with the blood tests noted above. The issue that appears to impact her life currently is the overwhelming fatigue and exhaustion and exercise intolerance that does not allow her to function as  she desires. There are many reasons why this situation may exist, which includes her immune dysregulation but  also may include abnormalities in her endocrine and muscular system and possible antibody mediated disease. I will contact her when the results of her blood tests return.   Paula Priest, MD Allergy / Immunology [facility] [place]

## 2022-01-13 ENCOUNTER — Encounter: Payer: Self-pay | Admitting: Allergy and Immunology

## 2022-01-14 ENCOUNTER — Telehealth: Payer: Self-pay | Admitting: Allergy and Immunology

## 2022-01-14 NOTE — Telephone Encounter (Signed)
Patient called very upset - pt would like for Dr. Lucie Leather to correct her medical records. Patient states she does not have pulmonary hypertension and having that in her record makes it difficult to get life insurance. Patient stated Dr. Lucie Leather does not have the capability to diagnose her with pulmonary hypertension and that should never have been put in her chart. Patient states she would liked this fixed today or she will go to the medical board to get it fixed.   I did advise patient Dr. Lucie Leather is working out of our [employer] and is actively seeing patients.

## 2022-01-16 LAB — STREP PNEUMONIAE 23 SEROTYPES IGG
Pneumo Ab Type 1*: <0.1 ug/mL — ABNORMAL LOW (ref >1.3)
Pneumo Ab Type 12 (12F)*: <0.1 ug/mL — ABNORMAL LOW (ref >1.3)
Pneumo Ab Type 14*: 0.2 ug/mL — ABNORMAL LOW (ref >1.3)
Pneumo Ab Type 17 (17F)*: 0.2 ug/mL — ABNORMAL LOW (ref >1.3)
Pneumo Ab Type 19 (19F)*: 1.2 ug/mL — ABNORMAL LOW (ref >1.3)
Pneumo Ab Type 2*: 0.9 ug/mL — ABNORMAL LOW (ref >1.3)
Pneumo Ab Type 20*: 0.3 ug/mL — ABNORMAL LOW (ref >1.3)
Pneumo Ab Type 22 (22F)*: 0.1 ug/mL — ABNORMAL LOW (ref >1.3)
Pneumo Ab Type 23 (23F)*: <0.1 ug/mL — ABNORMAL LOW (ref >1.3)
Pneumo Ab Type 26 (6B)*: <0.1 ug/mL — ABNORMAL LOW (ref >1.3)
Pneumo Ab Type 3*: 0.2 ug/mL — ABNORMAL LOW (ref >1.3)
Pneumo Ab Type 34 (10A)*: <0.1 ug/mL — ABNORMAL LOW (ref >1.3)
Pneumo Ab Type 4*: <0.1 ug/mL — ABNORMAL LOW (ref >1.3)
Pneumo Ab Type 43 (11A)*: <0.1 ug/mL — ABNORMAL LOW (ref >1.3)
Pneumo Ab Type 5*: <0.1 ug/mL — ABNORMAL LOW (ref >1.3)
Pneumo Ab Type 51 (7F)*: 0.3 ug/mL — ABNORMAL LOW (ref >1.3)
Pneumo Ab Type 54 (15B)*: 0.1 ug/mL — ABNORMAL LOW (ref >1.3)
Pneumo Ab Type 56 (18C)*: <0.1 ug/mL — ABNORMAL LOW (ref >1.3)
Pneumo Ab Type 57 (19A)*: 0.4 ug/mL — ABNORMAL LOW (ref >1.3)
Pneumo Ab Type 68 (9V)*: <0.1 ug/mL — ABNORMAL LOW (ref >1.3)
Pneumo Ab Type 70 (33F)*: 0.6 ug/mL — ABNORMAL LOW (ref >1.3)
Pneumo Ab Type 8*: 1 ug/mL — ABNORMAL LOW (ref >1.3)
Pneumo Ab Type 9 (9N)*: <0.1 ug/mL — ABNORMAL LOW (ref >1.3)

## 2022-01-16 LAB — PE (RFX IFE+FLC), S
A/G Ratio: 1.3 (ref 0.7–1.7)
Albumin ELP: 3.6 g/dL (ref 2.9–4.4)
Alpha 1: 0.3 g/dL (ref 0.0–0.4)
Alpha 2: 0.7 g/dL (ref 0.4–1.0)
Beta: 1 g/dL (ref 0.7–1.3)
Gamma Globulin: 0.8 g/dL (ref 0.4–1.8)
Globulin, Total: 2.8 g/dL (ref 2.2–3.9)
Total Protein: 6.4 g/dL (ref 6.0–8.5)

## 2022-01-16 LAB — CBC WITH DIFFERENTIAL/PLATELET
Basophils Absolute: 0 10*3/uL (ref 0.0–0.2)
Basos: 1 %
EOS (ABSOLUTE): 0.1 10*3/uL (ref 0.0–0.4)
Eos: 2 %
Hematocrit: 40 % (ref 34.0–46.6)
Hemoglobin: 13.6 g/dL (ref 11.1–15.9)
Immature Grans (Abs): 0 10*3/uL (ref 0.0–0.1)
Immature Granulocytes: 0 %
Lymphocytes Absolute: 0.9 10*3/uL (ref 0.7–3.1)
Lymphs: 22 %
MCH: 30.4 pg (ref 26.6–33.0)
MCHC: 34 g/dL (ref 31.5–35.7)
MCV: 89 fL (ref 79–97)
Monocytes Absolute: 0.4 10*3/uL (ref 0.1–0.9)
Monocytes: 10 %
Neutrophils Absolute: 2.7 10*3/uL (ref 1.4–7.0)
Neutrophils: 65 %
Platelets: 225 10*3/uL (ref 150–450)
RBC: 4.48 x10E6/uL (ref 3.77–5.28)
RDW: 12.5 % (ref 11.7–15.4)
WBC: 4.1 10*3/uL (ref 3.4–10.8)

## 2022-01-16 LAB — IGG, IGA, IGM
IgA/Immunoglobulin A, Serum: 167 mg/dL (ref 87–352)
IgG (Immunoglobin G), Serum: 624 mg/dL (ref 586–1602)
IgM (Immunoglobulin M), Srm: 419 mg/dL — ABNORMAL HIGH (ref 26–217)

## 2022-01-16 LAB — DIPHTHERIA / TETANUS ANTIBODY PANEL
Diphtheria Ab: 0.23 IU/mL (ref <0.10)
Tetanus Ab, IgG: 0.92 IU/mL (ref <0.10)

## 2022-01-16 LAB — C3 AND C4
Complement C3, Serum: 126 mg/dL (ref 82–167)
Complement C4, Serum: 15 mg/dL (ref 12–38)

## 2022-01-16 LAB — ALDOLASE: Aldolase: 4.1 U/L (ref 3.3–10.3)

## 2022-01-16 LAB — SEDIMENTATION RATE: Sed Rate: 14 mm/hr (ref 0–40)

## 2022-01-16 LAB — CORTISOL: Cortisol: 10.2 ug/dL (ref 6.2–19.4)

## 2022-01-16 LAB — C-REACTIVE PROTEIN: CRP: 2 mg/L (ref 0–10)

## 2022-01-16 LAB — THYROID PEROXIDASE ANTIBODY: Thyroperoxidase Ab SerPl-aCnc: 25 IU/mL (ref 0–34)

## 2022-01-16 LAB — CK: Total CK: 26 U/L — ABNORMAL LOW (ref 32–182)

## 2022-01-20 ENCOUNTER — Encounter: Payer: Self-pay | Admitting: Allergy and Immunology

## 2022-07-23 ENCOUNTER — Telehealth: Payer: Self-pay | Admitting: Cardiology

## 2022-07-23 NOTE — Telephone Encounter (Signed)
Attempted phone call to pt and left voicemail message to contact triage at [phone]. 

## 2022-07-23 NOTE — Telephone Encounter (Signed)
Pt would like to sch an echo and needs an order placed. She states she has one every year and never got a call about scheduling one last year

## 2022-07-28 ENCOUNTER — Telehealth: Payer: Self-pay

## 2022-07-28 NOTE — Telephone Encounter (Signed)
Left message for patient to call back regarding next appointment.

## 2022-08-06 ENCOUNTER — Telehealth: Payer: Self-pay

## 2022-08-06 NOTE — Telephone Encounter (Signed)
Patient scheduled for an appt 09/15/22 at 2:40 pm.

## 2022-08-13 ENCOUNTER — Other Ambulatory Visit: Payer: Self-pay | Admitting: Physical Medicine and Rehabilitation

## 2022-08-13 DIAGNOSIS — M545 Low back pain, unspecified: Secondary | ICD-10-CM

## 2022-08-29 ENCOUNTER — Ambulatory Visit
Admission: RE | Admit: 2022-08-29 | Discharge: 2022-08-29 | Disposition: A | Discharge status: Home / Self Care | Payer: BC Managed Care – PPO | Source: Ambulatory Visit | Attending: Physical Medicine and Rehabilitation | Admitting: Physical Medicine and Rehabilitation

## 2022-08-29 DIAGNOSIS — M545 Low back pain, unspecified: Secondary | ICD-10-CM

## 2022-09-13 NOTE — Progress Notes (Signed)
Cardiology Office Note    Date:  09/14/2022   ID:  Paula [name], DOB November 01, 1960, MRN [id number]  PCP:  Arnetha Gula, MD  Cardiologist:  Fransico Him, MD   Chief Complaint  Patient presents with   Aortic Stenosis    History of Present Illness:  Paula [name] is a 62 y.o. 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  She has been getting serial echoes to assess PA pressures to rule out pulmonary hypertension.  She was found on echo to have mild aortic stenosis.  Her last 2D echo was 07/07/2021 showing EF 60 to 65% with normal LV global strain, trivial MR.    She is here today for followup and is doing well.  She denies any chest pain or pressure, SOB, DOE, PND, orthopnea, LE edema, dizziness, palpitations or syncope. She is compliant with her meds and is tolerating meds with no SE.   Past Medical History:  Diagnosis Date   Anxiety    Aortic stenosis    mean AVG 58mg   Arthritis    [place]ST syndrome ([place]    Diverticulosis    Gastric ulcer    Hypercholesterolemia    Lyme disease    Thyroid disease    Vitamin B deficiency     Past Surgical History:  Procedure Laterality Date   KNEE ARTHROPLASTY     KNEE SURGERY  2007   OOPHORECTOMY  2004   SHOULDER SURGERY  03/2012   TOTAL SHOULDER ARTHROPLASTY      Current Medications: Current Meds  Medication Sig   Cholecalciferol (VITAMIN D PO) Take 7,500 Units by mouth daily.    levothyroxine (SYNTHROID) 88 MCG tablet Take 1 tablet by mouth daily.    liothyronine (CYTOMEL) 25 MCG tablet Take 25 mcg by mouth daily.    NALTREXONE HCL PO Take 4.5 mg by mouth daily.   omeprazole (PRILOSEC) 20 MG capsule Take 20 mg by mouth daily.   OVER THE COUNTER MEDICATION 12.5 mg daily.   OVER THE COUNTER MEDICATION    OVER THE COUNTER MEDICATION daily.   PRESCRIPTION MEDICATION Compounded estrogen cream daily   Probiotic Product (ADVANCED PROBIOTIC 10) CAPS Take by mouth.   Probiotic Product (PROBIOTIC  PO) Take by mouth daily.   progesterone (PROMETRIUM) 100 MG capsule Take 100 mg by mouth daily.    progesterone (PROMETRIUM) 100 MG capsule Take 100 mg by mouth at bedtime.   valACYclovir (VALTREX) 500 MG tablet Take 500 mg by mouth 2 (two) times daily.   vitamin E 400 UNIT capsule Take 800 Units by mouth daily.   Current Facility-Administered Medications for the 09/14/22 encounter (Office Visit) with TSueanne Margarita MD  Medication   betamethasone acetate-betamethasone sodium phosphate (CELESTONE) injection 3 mg    Allergies:   Celebrex [celecoxib] and Vioxx [rofecoxib]   Social History   Socioeconomic History   Marital status: Married    Spouse name: Not on file   Number of children: 2   Years of education: Not on file   Highest education level: Not on file  Occupational History   Occupation: retired  Tobacco Use   Smoking status: Former    Types: Cigarettes   Smokeless tobacco: Never   Tobacco comments:    college  VScientific laboratory technicianUse: Never used  Substance and Sexual Activity   Alcohol use: Yes    Comment: occ   Drug use: No   Sexual activity: Yes  Other Topics  Concern   Not on file  Social History Narrative   ** Merged History Encounter **       Social Determinants of Health   Financial Resource Strain: Not on file  Food Insecurity: Not on file  Transportation Needs: Not on file  Physical Activity: Not on file  Stress: Not on file  Social Connections: Not on file     Family History:  The patient's family history includes Diabetes in her sister; Healthy in her daughter and son; Heart attack in her father; Kidney disease in her father.   ROS:   Please see the history of present illness.    ROS All other systems reviewed and are negative.      No data to display             PHYSICAL EXAM:   VS:  BP 130/80   Pulse 83   Ht '5\' 7"'$  (1.702 m)   Wt 202 lb 9.6 oz (91.9 kg)   SpO2 99%   BMI 31.73 kg/m    GEN: Well nourished, well developed in no  acute distress HEENT: Normal NECK: No JVD; No carotid bruits LYMPHATICS: No lymphadenopathy CARDIAC:RRR, no murmurs, rubs, gallops RESPIRATORY:  Clear to auscultation without rales, wheezing or rhonchi  ABDOMEN: Soft, non-tender, non-distended MUSCULOSKELETAL:  No edema; No deformity  SKIN: Warm and dry NEUROLOGIC:  Alert and oriented x 3 PSYCHIATRIC:  Normal affect  Wt Readings from Last 3 Encounters:  09/14/22 202 lb 9.6 oz (91.9 kg)  01/13/22 205 lb (93 kg)  05/29/20 211 lb (95.7 kg)    Studies/Labs Reviewed:   EKG:  EKG is ordered today.  The ekg ordered today demonstrates NSR with no ST changes  Recent Labs: 01/12/2022: Hemoglobin 13.6; Platelets 225   Lipid Panel No results found for: "CHOL", "TRIG", "HDL", "CHOLHDL", "VLDL", "LDLCALC", "LDLDIRECT"   Additional studies/ records that were reviewed today include:  2D echo 2020    ASSESSMENT:    1. Nonrheumatic aortic valve stenosis   2. SS (systemic sclerosis) (HCC)      PLAN:  In order of problems listed above:  1.  Aortic stenosis -noted on echo in 2020 and very mild with mean AVG 44mHg -Repeat 2D echo  2.  Systemic sclerosis with CREST syndrome -Normal PA pressure on echo 06/2021 -Will repeat 2D echo to make sure PA pressures are still normal -she has Raynaud's disease and takes amlodipine PRN and topical NTG PRN and takes a natural supplement    Medication Adjustments/Labs and Tests Ordered: Current medicines are reviewed at length with the patient today.  Concerns regarding medicines are outlined above.  Medication changes, Labs and Tests ordered today are listed in the Patient Instructions below.  There are no Patient Instructions on file for this visit.   Signed, TFransico Him MD  09/14/2022 12:02 PM    [address]  [address]Phone: [phone] Fax: [phone]

## 2022-09-14 ENCOUNTER — Ambulatory Visit: Payer: BC Managed Care – PPO | Attending: Cardiology | Admitting: Cardiology

## 2022-09-14 ENCOUNTER — Encounter: Payer: Self-pay | Admitting: Cardiology

## 2022-09-14 VITALS — BP 130/80 | HR 83 | Ht 67.0 in | Wt 202.6 lb

## 2022-09-14 DIAGNOSIS — I350 Nonrheumatic aortic (valve) stenosis: Secondary | ICD-10-CM

## 2022-09-14 DIAGNOSIS — M349 Systemic sclerosis, unspecified: Secondary | ICD-10-CM | POA: Diagnosis not present

## 2022-09-14 NOTE — Patient Instructions (Addendum)
Medication Instructions:  Your physician recommends that you continue on your current medications as directed. Please refer to the Current Medication list given to you today.  *If you need a refill on your cardiac medications before your next appointment, please call your pharmacy*    Testing/Procedures: Your physician has requested that you have an echocardiogram. Echocardiography is a painless test that uses sound waves to create images of your heart. It provides your doctor with information about the size and shape of your heart and how well your heart's chambers and valves are working. This procedure takes approximately one hour. There are no restrictions for this procedure. Please do NOT wear cologne, perfume, aftershave, or lotions (deodorant is allowed). Please arrive 15 minutes prior to your appointment time.     Follow-Up: At [facility], you and your health needs are our priority.  As part of our continuing mission to provide you with exceptional heart care, we have created designated Provider Care Teams.  These Care Teams include your primary Cardiologist (physician) and Advanced Practice Providers (APPs -  Physician Assistants and Nurse Practitioners) who all work together to provide you with the care you need, when you need it.   Your next appointment:   1 year(s)  Provider:   Fransico Him, MD

## 2022-09-15 ENCOUNTER — Ambulatory Visit: Payer: BC Managed Care – PPO | Admitting: Cardiology

## 2022-10-07 ENCOUNTER — Ambulatory Visit (HOSPITAL_COMMUNITY): Payer: BC Managed Care – PPO | Attending: Cardiology

## 2022-10-07 DIAGNOSIS — I350 Nonrheumatic aortic (valve) stenosis: Secondary | ICD-10-CM

## 2022-10-07 DIAGNOSIS — M349 Systemic sclerosis, unspecified: Secondary | ICD-10-CM | POA: Diagnosis not present

## 2022-10-07 LAB — ECHOCARDIOGRAM COMPLETE
Area-P 1/2: 4.68 cm2
S' Lateral: 2.6 cm

## 2022-10-08 ENCOUNTER — Encounter: Payer: Self-pay | Admitting: Cardiology

## 2022-10-11 ENCOUNTER — Telehealth: Payer: Self-pay | Admitting: Cardiology

## 2022-10-11 ENCOUNTER — Telehealth: Payer: Self-pay

## 2022-10-11 NOTE — Telephone Encounter (Signed)
Patient would like to switch providers. Please advise

## 2022-10-11 NOTE — Telephone Encounter (Signed)
Call to patient to discuss her concerns about "calcifications" on mitral valve on Echo. Explained that calcifications often do not cause any problems and that checking her Echo over time will help to keep an eye on any progression. Patient verbalizes understanding.

## 2023-03-15 ENCOUNTER — Other Ambulatory Visit: Payer: Self-pay | Admitting: Physician Assistant

## 2023-03-15 DIAGNOSIS — M4802 Spinal stenosis, cervical region: Secondary | ICD-10-CM

## 2023-03-15 DIAGNOSIS — M5410 Radiculopathy, site unspecified: Secondary | ICD-10-CM

## 2023-03-26 ENCOUNTER — Other Ambulatory Visit: Payer: BC Managed Care – PPO

## 2023-04-01 ENCOUNTER — Ambulatory Visit
Admission: RE | Admit: 2023-04-01 | Discharge: 2023-04-01 | Disposition: A | Discharge status: Home / Self Care | Payer: BC Managed Care – PPO | Source: Ambulatory Visit | Attending: Physician Assistant | Admitting: Physician Assistant

## 2023-04-01 DIAGNOSIS — M5410 Radiculopathy, site unspecified: Secondary | ICD-10-CM

## 2023-04-01 DIAGNOSIS — M4802 Spinal stenosis, cervical region: Secondary | ICD-10-CM

## 2024-01-12 ENCOUNTER — Telehealth: Payer: Self-pay | Admitting: *Deleted

## 2024-01-12 NOTE — Telephone Encounter (Signed)
   Pre-operative Risk Assessment    Patient Name: Paula [name]  DOB: Jun 19, 1961 MRN: [id number]   Date of last office visit: 09/14/22 DR. TURNER Date of next office visit: NONE   Request for Surgical Clearance    Procedure:  RIGHT TOTAL KNEE ARTHROPLASTY  Date of Surgery:  Clearance 04/03/24                                Surgeon:  DR. DEMPSEY MOAN Surgeon's Group or Practice Name:  [facility] Phone number:  [phone] KERRI MAZE Fax number:  [phone]   Type of Clearance Requested:   - Medical ; NONE INDICATED ON FORM TO BE HELD   Type of Anesthesia:  CHOICE   Additional requests/questions:    Bonney Niels Jest   01/12/2024, 1:51 PM

## 2024-01-13 ENCOUNTER — Telehealth: Payer: Self-pay | Admitting: Cardiology

## 2024-01-13 ENCOUNTER — Telehealth: Payer: Self-pay

## 2024-01-13 DIAGNOSIS — I272 Pulmonary hypertension, unspecified: Secondary | ICD-10-CM

## 2024-01-13 NOTE — Telephone Encounter (Signed)
Patient has been scheduled for televisit.

## 2024-01-13 NOTE — Telephone Encounter (Signed)
 Pt called in to sch yearly f/u. She states she needs an echo scheduled prior to appt 02/16/24

## 2024-01-13 NOTE — Telephone Encounter (Signed)
   Name: Paula [name]  DOB: 1961-01-15  MRN: [id number]  Primary Cardiologist: Wilbert Bihari, MD   Preoperative team, please contact this patient and set up a phone call appointment for further preoperative risk assessment. Please obtain consent and complete medication review. Last seen by Dr. Bihari on 09/14/2022.Thank you for your help.  I confirm that guidance regarding antiplatelet and oral anticoagulation therapy has been completed and, if necessary, noted below. Not on any anticoagulation or antiplatelets   I also confirmed the patient resides in the state of [place] . As per [facility] Medical Board telemedicine laws, the patient must reside in the state in which the provider is licensed.   Lamarr Satterfield, NP 01/13/2024, 11:36 AM [facility]

## 2024-01-13 NOTE — Telephone Encounter (Signed)
 Spoke with patient and she would like to order an echo.  Is it ok to order echo

## 2024-01-13 NOTE — Telephone Encounter (Signed)
 Patient has been scheduled and consent is done     Patient Consent for Virtual Visit         Paula [name] has provided verbal consent on 01/13/2024 for a virtual visit (video or telephone).   CONSENT FOR VIRTUAL VISIT FOR:  Paula [name]  By participating in this virtual visit I agree to the following:  I hereby voluntarily request, consent and authorize [facility] and its employed or contracted physicians, physician assistants, nurse practitioners or other licensed health care professionals (the Practitioner), to provide me with telemedicine health care services (the "Services) as deemed necessary by the treating Practitioner. I acknowledge and consent to receive the Services by the Practitioner via telemedicine. I understand that the telemedicine visit will involve communicating with the Practitioner through live audiovisual communication technology and the disclosure of certain medical information by electronic transmission. I acknowledge that I have been given the opportunity to request an in-person assessment or other available alternative prior to the telemedicine visit and am voluntarily participating in the telemedicine visit.  I understand that I have the right to withhold or withdraw my consent to the use of telemedicine in the course of my care at any time, without affecting my right to future care or treatment, and that the Practitioner or I may terminate the telemedicine visit at any time. I understand that I have the right to inspect all information obtained and/or recorded in the course of the telemedicine visit and may receive copies of available information for a reasonable fee.  I understand that some of the potential risks of receiving the Services via telemedicine include:  Delay or interruption in medical evaluation due to technological equipment failure or disruption; Information transmitted may not be sufficient (e.g. poor resolution of images) to allow for  appropriate medical decision making by the Practitioner; and/or  In rare instances, security protocols could fail, causing a breach of personal health information.  Furthermore, I acknowledge that it is my responsibility to provide information about my medical history, conditions and care that is complete and accurate to the best of my ability. I acknowledge that Practitioner's advice, recommendations, and/or decision may be based on factors not within their control, such as incomplete or inaccurate data provided by me or distortions of diagnostic images or specimens that may result from electronic transmissions. I understand that the practice of medicine is not an exact science and that Practitioner makes no warranties or guarantees regarding treatment outcomes. I acknowledge that a copy of this consent can be made available to me via my patient portal [facility] MyChart), or I can request a printed copy by calling the office of  [facility].    I understand that my insurance will be billed for this visit.   I have read or had this consent read to me. I understand the contents of this consent, which adequately explains the benefits and risks of the Services being provided via telemedicine.  I have been provided ample opportunity to ask questions regarding this consent and the Services and have had my questions answered to my satisfaction. I give my informed consent for the services to be provided through the use of telemedicine in my medical care

## 2024-01-16 NOTE — Telephone Encounter (Signed)
 MC to patient to advise Dr. Shlomo recommends echo for pulmonary hypertension, order placed.

## 2024-02-16 ENCOUNTER — Ambulatory Visit: Attending: Cardiology | Admitting: Cardiology

## 2024-02-16 ENCOUNTER — Encounter: Payer: Self-pay | Admitting: Cardiology

## 2024-02-16 ENCOUNTER — Telehealth: Payer: Self-pay | Admitting: Cardiology

## 2024-02-16 VITALS — BP 140/72 | HR 69 | Ht 67.0 in | Wt 200.0 lb

## 2024-02-16 DIAGNOSIS — I350 Nonrheumatic aortic (valve) stenosis: Secondary | ICD-10-CM | POA: Diagnosis not present

## 2024-02-16 DIAGNOSIS — M349 Systemic sclerosis, unspecified: Secondary | ICD-10-CM | POA: Diagnosis not present

## 2024-02-16 NOTE — Telephone Encounter (Signed)
 Patient has a televisit scheduled for preop clearance patient canceled it because patient got an appointment with Dr. Shlomo today patient is scheduled to have a right total knee arthroplasty on 04/03/24 anesthesia choice. Has patient been cleared for surgery please advise

## 2024-02-16 NOTE — Addendum Note (Signed)
 Addended by: Tammi Boulier L on: 02/16/2024 09:54 AM   Modules accepted: Orders

## 2024-02-16 NOTE — Progress Notes (Signed)
 Cardiology Office Note    Date:  02/16/2024   ID:  Tinya Cadogan, DOB 05/13/1961, MRN [id number]  PCP:  Zackary Megan ORN, MD  Cardiologist:  Wilbert Bihari, MD   Chief Complaint  Patient presents with   Follow-up    Aortic valve disease and systemic sclerosis follow-up yearly screening for pulmonary hypertension    History of Present Illness:  Paula [name] is a 63 y.o. 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  She has been getting serial echoes to assess PA pressures to rule out pulmonary hypertension.  She was found on echo to have mild aortic stenosis.  Her last 2D echo was 07/07/2021 showing EF 60 to 65% with normal LV global strain, trivial MR.    She is here today for follow-up and is doing well.  She denies any chest pain or pressure, shortness of breath, DOE, PND, orthopnea, lower extremity edema, dizziness, syncope, palpitations.  Past Medical History:  Diagnosis Date   Anxiety    Aortic stenosis    mean AVG   Arthritis    CREST syndrome (HCC)    Diverticulosis    Gastric ulcer    Hypercholesterolemia    Lyme disease    Thyroid  disease    Vitamin B deficiency     Past Surgical History:  Procedure Laterality Date   KNEE ARTHROPLASTY     KNEE SURGERY  2007   OOPHORECTOMY  2004   SHOULDER SURGERY  03/2012   TOTAL SHOULDER ARTHROPLASTY      Current Medications: Current Meds  Medication Sig   Ascorbic Acid (VITAMIN C) 1000 MG tablet Take 2,000 mg by mouth daily.    Cholecalciferol (VITAMIN D  PO) Take 7,500 Units by mouth daily.    doxycycline  (VIBRAMYCIN ) 50 MG capsule TAKE 1 CAPSULE BY MOUTH EVERY DAY WITH FOOD   levothyroxine (SYNTHROID) 88 MCG tablet Take 1 tablet by mouth daily.    liothyronine (CYTOMEL) 25 MCG tablet Take 25 mcg by mouth daily.    NALTREXONE HCL PO Take 4.5 mg by mouth daily.   Omega-3 Fatty Acids (FISH OIL) 1200 MG CAPS 1 capsule.   omeprazole  (PRILOSEC) 20 MG capsule Take 20 mg by mouth  daily.   OVER THE COUNTER MEDICATION 12.5 mg daily.   OVER THE COUNTER MEDICATION    OVER THE COUNTER MEDICATION daily.   PRESCRIPTION MEDICATION Compounded estrogen cream daily   Probiotic Product (ADVANCED PROBIOTIC 10) CAPS Take by mouth.   Probiotic Product (PROBIOTIC PO) Take by mouth daily.   progesterone (PROMETRIUM) 100 MG capsule Take 100 mg by mouth daily.    progesterone (PROMETRIUM) 100 MG capsule Take 100 mg by mouth at bedtime.   valACYclovir (VALTREX) 500 MG tablet Take 500 mg by mouth 2 (two) times daily.   vitamin E 400 UNIT capsule Take 800 Units by mouth daily.   Current Facility-Administered Medications for the 02/16/24 encounter (Office Visit) with Bihari Wilbert SAUNDERS, MD  Medication   betamethasone  acetate-betamethasone  sodium phosphate (CELESTONE ) injection 3 mg    Allergies:   Celebrex [celecoxib] and Vioxx [rofecoxib]   Social History   Socioeconomic History   Marital status: Married    Spouse name: Not on file   Number of children: 2   Years of education: Not on file   Highest education level: Not on file  Occupational History   Occupation: retired  Tobacco Use   Smoking status: Former    Types: Cigarettes   Smokeless tobacco:  Never   Tobacco comments:    college  Vaping Use   Vaping status: Never Used  Substance and Sexual Activity   Alcohol use: Yes    Comment: occ   Drug use: No   Sexual activity: Yes  Other Topics Concern   Not on file  Social History Narrative   ** Merged History Encounter **       Social Drivers of Corporate investment banker Strain: Not on file  Food Insecurity: No Food Insecurity (11/10/2023)   Received from [facility]   Hunger Vital Sign    Within the past 12 months, you worried that your food would run out before you got the money to buy more.: Never true    Within the past 12 months, the food you bought just didn't last and you didn't have money to get more.: Never true  Transportation Needs: No  Transportation Needs (11/10/2023)   Received [facility]    In the past 12 months, has lack of transportation kept you from medical appointments or from getting medications?: No    Lack of Transportation (Non-Medical): No  Physical Activity: Not on file  Stress: Not on file  Social Connections: Not on file     Family History:  The patient's family history includes Diabetes in her sister; Healthy in her daughter and son; Heart attack in her father; Kidney disease in her father.   ROS:   Please see the history of present illness.    ROS All other systems reviewed and are negative.      No data to display             PHYSICAL EXAM:   VS:  BP (!) 140/72   Pulse 69   Ht 5' 7 (1.702 m)   Wt 200 lb (90.7 kg)   SpO2 99%   BMI 31.32 kg/m    GEN: Well nourished, well developed in no acute distress HEENT: Normal NECK: No JVD; No carotid bruits LYMPHATICS: No lymphadenopathy CARDIAC:RRR, no  rubs, gallops 2/6 SM at RUSB RESPIRATORY:  Clear to auscultation without rales, wheezing or rhonchi  ABDOMEN: Soft, non-tender, non-distended MUSCULOSKELETAL:  No edema; No deformity  SKIN: Warm and dry NEUROLOGIC:  Alert and oriented x 3 PSYCHIATRIC:  Normal affect  Wt Readings from Last 3 Encounters:  02/16/24 200 lb (90.7 kg)  09/14/22 202 lb 9.6 oz (91.9 kg)  01/13/22 205 lb (93 kg)    Studies/Labs Reviewed:    Recent Labs: No results found for requested labs within last 365 days.   Lipid Panel No results found for: CHOL, TRIG, HDL, CHOLHDL, VLDL, LDLCALC, LDLDIRECT   Additional studies/ records that were reviewed today include:  2D echo 2020 EKG Interpretation Date/Time:  Thursday February 16 2024 09:34:10 EDT Ventricular Rate:  69 PR Interval:  148 QRS Duration:  86 QT Interval:  384 QTC Calculation: 411 R Axis:   -14  Text Interpretation: Normal sinus rhythm Normal ECG No previous ECGs available  Confirmed by Shlomo Corning ([id number]) on 02/16/2024 9:39:08 AM     ASSESSMENT:    1. Nonrheumatic aortic valve stenosis   2. SS (systemic sclerosis) (HCC)      PLAN:  In order of problems listed above:  1.  Aortic valve sclerosis with no stenosis -noted on echo in 2020 and very mild with mean AVG -Echo 10/07/2022 showed EF 70 to 75% with no evidence of aortic stenosis  2.  Systemic sclerosis with CREST syndrome -Normal PA pressure on echo 06/2021 -2D echo 10/06/2022 could not assess PA pressures due to lack of TR jet -she has Raynaud's disease and takes amlodipine  PRN and topical NTG PRN and takes a natural supplement -Repeat 2D echo to reassess   3.  Heart Murmur -2D echo with AV sclerosis  Followup with me in 1 year  Medication Adjustments/Labs and Tests Ordered: Current medicines are reviewed at length with the patient today.  Concerns regarding medicines are outlined above.  Medication changes, Labs and Tests ordered today are listed in the Patient Instructions below.  There are no Patient Instructions on file for this visit.   Signed, Wilbert Bihari, MD  02/16/2024 9:40 AM    [facility] [address] [place], [place], [place]  [address] Phone: [phone]; Fax: [phone]

## 2024-02-16 NOTE — Telephone Encounter (Signed)
 Patient canceled HVT PRE OP CLEARANCE APP visit on 8/11 stating she saw Dr. Shlomo sooner.

## 2024-02-16 NOTE — Telephone Encounter (Signed)
 Dr. Shlomo,  Paula [name] was seen by you in clinic today.  She has planned upcoming right total knee arthroplasty.  Would you be able to provide recommendations for cardiac risk given her recent appointment?  Thank you for your help.  Please direct your response to CV DIV preop pool.  Paula [name]. Paula Waldo NP-C     02/16/2024, 3:03 PM [facility] [address] Office [phone] Fax [phone]

## 2024-02-16 NOTE — Patient Instructions (Signed)
 Medication Instructions:  Your physician recommends that you continue on your current medications as directed. Please refer to the Current Medication list given to you today.  *If you need a refill on your cardiac medications before your next appointment, please call your pharmacy*  Lab Work: None.  If you have labs (blood work) drawn today and your tests are completely normal, you will receive your results only by: MyChart Message (if you have MyChart) OR A paper copy in the mail If you have any lab test that is abnormal or we need to change your treatment, we will call you to review the results.  Testing/Procedures: Your physician has requested that you have an echocardiogram. Echocardiography is a painless test that uses sound waves to create images of your heart. It provides your doctor with information about the size and shape of your heart and how well your heart's chambers and valves are working. This procedure takes approximately one hour. There are no restrictions for this procedure. Please do NOT wear cologne, perfume, aftershave, or lotions (deodorant is allowed). Please arrive 15 minutes prior to your appointment time.  Please note: We ask at that you not bring children with you during ultrasound (echo/ vascular) testing. Due to room size and safety concerns, children are not allowed in the ultrasound rooms during exams. Our front office staff cannot provide observation of children in our lobby area while testing is being conducted. An adult accompanying a patient to their appointment will only be allowed in the ultrasound room at the discretion of the ultrasound technician under special circumstances. We apologize for any inconvenience.   Follow-Up: At [facility], you and your health needs are our priority.  As part of our continuing mission to provide you with exceptional heart care, our providers are all part of one team.  This team includes your primary Cardiologist  (physician) and Advanced Practice Providers or APPs (Physician Assistants and Nurse Practitioners) who all work together to provide you with the care you need, when you need it.  Your next appointment:   1 year(s)  Provider:   Wilbert Bihari, MD

## 2024-02-17 NOTE — Telephone Encounter (Signed)
 Will route to the surgeons office.

## 2024-02-17 NOTE — Telephone Encounter (Signed)
     Primary Cardiologist: Wilbert Bihari, MD  Chart reviewed as part of pre-operative protocol coverage. Given past medical history and time since last visit, based on ACC/AHA guidelines, Paula [name] would be at acceptable risk for the planned procedure without further cardiovascular testing.   Per [name] is stable from a cardiac standpoint and is okay to proceed with surgery. Her perioperative cardiac risk of major cardiac event is 0.4%   I will route this recommendation to the requesting party via Epic fax function and remove from pre-op pool.  Please call with questions.  Paula [name]. Paula Stelzer NP-C     02/17/2024, 11:20 AM [facility] [address] Office [phone] Fax [phone]

## 2024-02-27 ENCOUNTER — Ambulatory Visit

## 2024-03-01 ENCOUNTER — Ambulatory Visit (HOSPITAL_COMMUNITY)
Admission: RE | Admit: 2024-03-01 | Discharge: 2024-03-01 | Disposition: A | Discharge status: Home / Self Care | Source: Ambulatory Visit | Attending: Cardiology | Admitting: Cardiology

## 2024-03-01 DIAGNOSIS — I272 Pulmonary hypertension, unspecified: Secondary | ICD-10-CM

## 2024-03-01 LAB — ECHOCARDIOGRAM COMPLETE
Area-P 1/2: 3.68 cm2
S' Lateral: 2.4 cm

## 2024-03-02 ENCOUNTER — Ambulatory Visit: Payer: Self-pay | Admitting: Cardiology

## 2024-03-05 ENCOUNTER — Encounter: Payer: Self-pay | Admitting: Cardiology

## 2024-03-12 ENCOUNTER — Telehealth: Payer: Self-pay

## 2024-03-12 NOTE — Telephone Encounter (Signed)
 Dr. Shlomo , patient's chart was reviewed for preoperative cardiac evaluation.  She was seen by you on 02/16/24 and according to protocol, we request that you comment on cardiac risk for upcoming procedure since office visit was less than 2 months ago.    Please route your response to p cv div preop.  Thank you, [name], NP-C 03/12/2024, 12:43 PM

## 2024-03-12 NOTE — Telephone Encounter (Signed)
   Pre-operative Risk Assessment    Patient Name: Paula [name]  DOB: [birth year] MRN: [id number]   Date of last office visit: 02/16/24 Date of next office visit: N/A   Request for Surgical Clearance    Procedure:  left total knee arthroplasty   Date of Surgery:  Clearance 04/24/24                                 Surgeon:  Dr. Dempsey Moan  Surgeon's Group or Practice Name:  [facility]  Phone number:  [phone] Fax number:  [phone]   Type of Clearance Requested:   - Medical    Type of Anesthesia:  Choice    Additional requests/questions:    SignedRebeca Blight   03/12/2024, 12:22 PM

## 2024-03-13 NOTE — Telephone Encounter (Signed)
   Primary Cardiologist: Wilbert Bihari, MD  Chart reviewed as part of pre-operative protocol coverage. Her risk of MACE is low and she is able to achieve > 4 METS activity without concerning cardiac symptoms. Given past medical history and time since last visit, based on ACC/AHA guidelines, Paula [name] would be at acceptable risk for the planned procedure without further cardiovascular testing.   Advised patient to call back if she develops new symptoms that are concerning from a cardiac perspective prior to surgery.  No request to hold cardiac medications.  I will route this recommendation to the requesting party via Epic fax function and remove from pre-op pool.  Please call with questions.  Paula EMERSON Bane, NP-C 03/13/2024, 3:37 PM [address] [place], [place] [address] Office [phone] Fax [phone]

## 2024-03-13 NOTE — Telephone Encounter (Signed)
 Left message for patient to call back to assess functional status for preop clearance.  [name], NP-C  03/13/2024, 3:29 PM [address] [place], [place] [address] Office [phone] Fax [phone]

## 2024-04-09 ENCOUNTER — Ambulatory Visit: Admitting: Podiatry

## 2024-04-23 ENCOUNTER — Ambulatory Visit: Admitting: Podiatry

## 2024-05-17 ENCOUNTER — Inpatient Hospital Stay: Attending: Nurse Practitioner | Admitting: Nurse Practitioner

## 2024-05-17 ENCOUNTER — Inpatient Hospital Stay

## 2024-05-17 VITALS — BP 170/88 | HR 81 | Temp 97.3°F | Resp 15 | Wt 201.2 lb

## 2024-05-17 DIAGNOSIS — D500 Iron deficiency anemia secondary to blood loss (chronic): Secondary | ICD-10-CM | POA: Insufficient documentation

## 2024-05-17 DIAGNOSIS — D509 Iron deficiency anemia, unspecified: Secondary | ICD-10-CM | POA: Diagnosis present

## 2024-05-17 DIAGNOSIS — Z87891 Personal history of nicotine dependence: Secondary | ICD-10-CM | POA: Diagnosis not present

## 2024-05-17 LAB — CBC WITH DIFFERENTIAL (CANCER CENTER ONLY)
Abs Immature Granulocytes: 0.01 K/uL (ref 0.00–0.07)
Basophils Absolute: 0 K/uL (ref 0.0–0.1)
Basophils Relative: 1 %
Eosinophils Absolute: 0.1 K/uL (ref 0.0–0.5)
Eosinophils Relative: 3 %
HCT: 38.2 % (ref 36.0–46.0)
Hemoglobin: 11.4 g/dL — ABNORMAL LOW (ref 12.0–15.0)
Immature Granulocytes: 0 %
Lymphocytes Relative: 25 %
Lymphs Abs: 1.1 K/uL (ref 0.7–4.0)
MCH: 24.7 pg — ABNORMAL LOW (ref 26.0–34.0)
MCHC: 29.8 g/dL — ABNORMAL LOW (ref 30.0–36.0)
MCV: 82.9 fL (ref 80.0–100.0)
Monocytes Absolute: 0.3 K/uL (ref 0.1–1.0)
Monocytes Relative: 7 %
Neutro Abs: 2.9 K/uL (ref 1.7–7.7)
Neutrophils Relative %: 64 %
Platelet Count: 225 K/uL (ref 150–400)
RBC: 4.61 MIL/uL (ref 3.87–5.11)
RDW: 17.9 % — ABNORMAL HIGH (ref 11.5–15.5)
WBC Count: 4.5 K/uL (ref 4.0–10.5)
nRBC: 0 % (ref 0.0–0.2)

## 2024-05-17 LAB — RETICULOCYTES
Immature Retic Fract: 23.6 % — ABNORMAL HIGH (ref 2.3–15.9)
RBC.: 4.55 MIL/uL (ref 3.87–5.11)
Retic Count, Absolute: 90.1 K/uL (ref 19.0–186.0)
Retic Ct Pct: 2 % (ref 0.4–3.1)

## 2024-05-17 LAB — FOLATE: Folate: >20 ng/mL (ref >5.9)

## 2024-05-17 LAB — IRON AND IRON BINDING CAPACITY (CC-WL,HP ONLY)
Iron: 26 ug/dL — ABNORMAL LOW (ref 28–170)
Saturation Ratios: 5 % — ABNORMAL LOW (ref 10.4–31.8)
TIBC: 500 ug/dL — ABNORMAL HIGH (ref 250–450)
UIBC: 474 ug/dL — ABNORMAL HIGH (ref 148–442)

## 2024-05-17 LAB — VITAMIN B12: Vitamin B-12: 1155 pg/mL — ABNORMAL HIGH (ref 180–914)

## 2024-05-17 LAB — FERRITIN: Ferritin: 67 ng/mL (ref 11–307)

## 2024-05-17 NOTE — Progress Notes (Addendum)
 [facility]   [phone] [phone]   Clinic New consult Note   Patient Care Team: Zackary Megan ORN, MD as PCP - General (Family Medicine) Shlomo Wilbert SAUNDERS, MD as PCP - Cardiology (Cardiology) 05/17/2024  CHIEF COMPLAINTS/PURPOSE OF CONSULTATION:  Iron deficiency anemia   HISTORY OF PRESENTING ILLNESS:  Paula [name] 63 y.o. female with medical history consistent with CREST autoimmune disorder. She was scheduled to have knee surgery earlier this year. When labs were checked for presurgical clearance, she was found to have anemia with Hgb of 9.8 and 33.6. this was on 04/19/2024. Her iron was 10 with iron saturation of 2%. The ferritin was 8. Since then, she has been on oral, liquid iron supplement. She has received 3 treatments with IV ferrlecit of 62.5 mg each. She also had endoscopy and colonoscopy one since then.  Both tests were done on 04/25/2024.  Endoscopy showed a small hiatal hernia.  She has LA grade a reflux esophagitis with no bleeding.  There was benign-appearing esophageal stenosis.  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  Her colonoscopy showed hemorrhoids on the perianal exam and internal hemorrhoids.  The examined ileum was normal.  There was diverticulitis in the descending colon and the transverse colon.  The colonoscopy was otherwise normal on direct and retroflexion views. Her colonoscopy report, per Dr. 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  She had a repeat CBC done on 05/09/2024 with her primary care provider after 3 treatments with IV Ferrlecit.  Hemoglobin was 10.2 and hematocrit was normal at 34.7.  She did receive an additional dose of IV Ferrlecit 62.5 mg since 05/09/2024. She denies recent chest pain on exertion, shortness of breath on minimal exertion,  pre-syncopal episodes, or palpitations.  She states that before her treatment with IV iron, she did have shortness of breath with exertion and easy fatigability. She had not noticed any recent bleeding such as epistaxis, hematuria or hematochezia.  She had noticed a few episodes of dark and tarry stools.  No frank red blood was noticed..The patient denies over the counter NSAID ingestion. She is not  on antiplatelets agents. She had no prior history or diagnosis of cancer. Her age appropriate screening programs are up-to-date. She denies any pica and eats a variety of diet. She never donated blood or received blood transfusion The patient was prescribed oral iron supplements and she takes approximately 12 mg oral iron and solution formula daily.  She states any more than that causes her to have upset stomach. Socially, the patient is married.  She is retired.  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  They have 2 children who live nearby.  She denies smoking.  Very rarely socially drinks.  She denies any illegal or illicit drug use.   REVIEW OF SYSTEMS:   Constitutional: Denies fevers, chills or abnormal night sweats.  Eyes: Denies blurriness of vision, double vision or watery eyes Ears, nose, mouth, throat, and face: Denies mucositis or sore throat Respiratory: Denies cough, dyspnea or wheezes Cardiovascular: Denies palpitation, chest discomfort or lower extremity swelling Gastrointestinal:  Denies nausea or change in bowel habits.  She does have chronic GERD and often dysphagia. Skin: Denies abnormal skin rashes Lymphatics: Denies new lymphadenopathy or easy bruising Neurological:Denies numbness, tingling or new weaknesses Behavioral/Psych: Mood is stable, no new  changes   All other systems were reviewed with the patient and are negative.   MEDICAL HISTORY:  Past Medical History:  Diagnosis Date   Anxiety    Aortic stenosis    mean AVG   Arthritis     CREST syndrome (HCC)    Diverticulosis    Gastric ulcer    Hypercholesterolemia    Lyme disease    Thyroid  disease    Vitamin B deficiency     SURGICAL HISTORY: Past Surgical History:  Procedure Laterality Date   KNEE ARTHROPLASTY     KNEE SURGERY  2007   OOPHORECTOMY  2004   SHOULDER SURGERY  03/2012   TOTAL SHOULDER ARTHROPLASTY      SOCIAL HISTORY: Social History   Socioeconomic History   Marital status: Married    Spouse name: Not on file   Number of children: 2   Years of education: Not on file   Highest education level: Not on file  Occupational History   Occupation: retired  Tobacco Use   Smoking status: Former    Types: Cigarettes   Smokeless tobacco: Never   Tobacco comments:    college  Building Services Engineer status: Never Used  Substance and Sexual Activity   Alcohol use: Yes    Comment: occ   Drug use: No   Sexual activity: Yes  Other Topics Concern   Not on file  Social History Narrative   ** Merged History Encounter **       Social Drivers of Health   Financial Resource Strain: Not on file  Food Insecurity: No Food Insecurity (05/17/2024)   Hunger Vital Sign    Worried About Running Out of Food in the Last Year: Never true    Ran Out of Food in the Last Year: Never true  Transportation Needs: No Transportation Needs (05/17/2024)   PRAPARE - Administrator, Civil Service (Medical): No    Lack of Transportation (Non-Medical): No  Physical Activity: Not on file  Stress: Not on file  Social Connections: Not on file  Intimate Partner Violence: Not on file    FAMILY HISTORY: Family History  Problem Relation Age of Onset   Heart attack Father    Kidney disease Father    Diabetes Sister    Healthy Daughter    Healthy Son    Colon cancer Neg Hx    Pancreatic cancer Neg Hx    Rectal cancer Neg Hx    Stomach cancer Neg Hx     ALLERGIES:  is allergic to celebrex [celecoxib] and vioxx [rofecoxib].  MEDICATIONS:  Current  Outpatient Medications  Medication Sig Dispense Refill   Ascorbic Acid (VITAMIN C) 1000 MG tablet Take 2,000 mg by mouth daily.      Cholecalciferol (VITAMIN D  PO) Take 7,500 Units by mouth daily.      doxycycline  (VIBRAMYCIN ) 50 MG capsule TAKE 1 CAPSULE BY MOUTH EVERY DAY WITH FOOD     levothyroxine (SYNTHROID) 88 MCG tablet Take 1 tablet by mouth daily.      liothyronine (CYTOMEL) 25 MCG tablet Take 25 mcg by mouth daily.      NALTREXONE HCL PO Take 4.5 mg by mouth daily.     Omega-3 Fatty Acids (FISH OIL) 1200 MG CAPS 1 capsule.     omeprazole  (PRILOSEC) 20 MG capsule Take 20 mg by mouth daily.     OVER THE COUNTER MEDICATION 12.5 mg daily.     OVER THE COUNTER MEDICATION  OVER THE COUNTER MEDICATION daily.     PRESCRIPTION MEDICATION Compounded estrogen cream daily     Probiotic Product (ADVANCED PROBIOTIC 10) CAPS Take by mouth.     Probiotic Product (PROBIOTIC PO) Take by mouth daily.     progesterone (PROMETRIUM) 100 MG capsule Take 100 mg by mouth at bedtime.     valACYclovir (VALTREX) 500 MG tablet Take 500 mg by mouth 2 (two) times daily.     vitamin E 400 UNIT capsule Take 800 Units by mouth daily.     progesterone (PROMETRIUM) 100 MG capsule Take 100 mg by mouth daily.      Current Facility-Administered Medications  Medication Dose Route Frequency Provider Last Rate Last Admin   betamethasone  acetate-betamethasone  sodium phosphate (CELESTONE ) injection 3 mg  3 mg Intra-articular Once Janit Thresa HERO, DPM        PHYSICAL EXAMINATION: ECOG PERFORMANCE STATUS: 1 - Symptomatic but completely ambulatory  Vitals:   05/17/24 1356  BP: (!) 170/88  Pulse: 81  Resp: 15  Temp: (!) 97.3 F (36.3 C)  SpO2: 100%   Filed Weights   05/17/24 1356  Weight: 201 lb 3.2 oz (91.3 kg)    GENERAL:alert, no distress and comfortable SKIN: skin color, texture, turgor are normal, no rashes or significant lesions EYES: normal, conjunctiva are pink and non-injected, sclera  clear OROPHARYNX:no exudate, no erythema and lips, buccal mucosa, and tongue normal  NECK: supple, thyroid  normal size, non-tender, without nodularity LYMPH:  no palpable lymphadenopathy in the cervical, axillary or inguinal LUNGS: clear to auscultation and percussion with normal breathing effort HEART: regular rate & rhythm and no murmurs and no lower extremity edema ABDOMEN:abdomen soft, non-tender and normal bowel sounds Musculoskeletal:no cyanosis of digits and no clubbing  PSYCH: alert & oriented x 3 with fluent speech NEURO: no focal motor/sensory deficits  LABORATORY DATA:  I have reviewed the data as listed    Latest Ref Rng & Units 05/17/2024    3:22 PM 01/12/2022    3:31 PM 04/05/2018    3:05 PM  CBC  WBC 4.0 - 10.5 K/uL 4.5  4.1  4.5   Hemoglobin 12.0 - 15.0 g/dL 88.5  86.3  86.2   Hematocrit 36.0 - 46.0 % 38.2  40.0  40.9   Platelets 150 - 400 K/uL 225  225  232        Latest Ref Rng & Units 01/12/2022    3:31 PM 04/05/2018    3:05 PM 03/14/2017    3:40 PM  CMP  Glucose 65 - 99 mg/dL  872  99   BUN 7 - 25 mg/dL  23  17   Creatinine 9.49 - 1.05 mg/dL  9.21  9.19   Sodium 864 - 146 mmol/L  141  139   Potassium 3.5 - 5.3 mmol/L  5.3  4.6   Chloride 98 - 110 mmol/L  109  106   CO2 20 - 32 mmol/L  23  29   Calcium 8.6 - 10.4 mg/dL  89.0  89.7   Total Protein 6.0 - 8.5 g/dL 6.4  6.2    6.2    Total Bilirubin 0.2 - 1.2 mg/dL  0.4    AST 10 - 35 U/L  17    ALT 6 - 29 U/L  23     Assessment and plan  Iron deficiency anemia, unspecified iron deficiency anemia type Assessment & Plan: The patient has medical history consistent with CREST autoimmune disorder. She was scheduled to have knee  surgery earlier this year. When labs were checked for presurgical clearance, she was found to have anemia with Hgb of 9.8 and 33.6. this was on 04/19/2024. Her iron was 10 with iron saturation of 2%. The ferritin was 8. Since then, she has been on oral, liquid iron supplement. She has  received 3 treatments with IV ferrlecit of 62.5 mg each. She also had endoscopy and colonoscopy one since then.  Both tests were done on 04/25/2024.  Endoscopy showed a small hiatal hernia.  She has LA grade a reflux esophagitis with no bleeding.  There was benign-appearing esophageal stenosis.  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  Her colonoscopy showed hemorrhoids on the perianal exam and internal hemorrhoids.  The examined ileum was normal.  There was diverticulitis in the descending colon and the transverse colon.  The colonoscopy was otherwise normal on direct and retroflexion views. Her colonoscopy report, per Dr. 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  She had a repeat CBC done on 05/09/2024 with her primary care provider after 3 treatments with IV Ferrlecit.  Hemoglobin was 10.2 and hematocrit was normal at 34.7.  She did receive an additional dose of IV Ferrlecit 62.5 mg since 05/09/2024. Her knee replacement surgery is now scheduled for 06/12/2024.  The goal is to get hemoglobin levels within normal limits.  She has had normal hemoglobin hematocrit in the past.  Ferritin has always been low.  Patient states when checked, her ferritin has always been <10.  Recommend patient continue with oral iron daily.  It is understood that she has been unable to tolerate doses higher than 12 mg daily.  Will redraw CBC, ferritin, iron panel, B12, and folate levels today.  Based on these results, we will likely offer additional IV iron. The patient understands that IV iron treatments are administered at [facility]. IV iron does have minimal risk of negative side effects, mainly, allergic reactions. Reactions can consist of general pruritus and rash. Can include full anaphylactic reaction. I do give premedications (Tylenol and  benadryl) to reduce risk of allergic reactions. The patient voiced understanding of all information and agrees to move forward with treatment.   Orders: -     Vitamin B12; Future -     Folate; Future -     Reticulocytes; Future -     Iron and Iron Binding Capacity ([facility] only); Future -     Ferritin; Future -     CBC with Differential ([facility] Only); Future      Orders Placed This Encounter  Procedures   Vitamin B12    Standing Status:   Future    Number of Occurrences:   1    Expected Date:   05/17/2024    Expiration Date:   08/15/2024   Folate    Standing Status:   Future    Number of Occurrences:   1    Expected Date:   05/17/2024    Expiration Date:   08/15/2024   Reticulocytes    Standing Status:   Future    Number of Occurrences:   1    Expected Date:   05/17/2024    Expiration Date:   08/15/2024   Iron and Iron Binding Capacity ([facility] only)    Standing Status:   Future    Number of Occurrences:   1  Expected Date:   05/17/2024    Expiration Date:   08/15/2024   Ferritin    Standing Status:   Future    Number of Occurrences:   1    Expected Date:   05/17/2024    Expiration Date:   08/15/2024   CBC with Differential (Cancer Center Only)    Standing Status:   Future    Number of Occurrences:   1    Expected Date:   05/17/2024    Expiration Date:   08/15/2024   This was a shared visit with Dr. Lanny. All questions were answered. The patient knows to call the clinic with any problems, questions or concerns.    Powell FORBES Lessen, NP 05/17/2024 4:39 PM   Addendum I have seen the patient, examined her. I agree with the assessment and and plan and have edited the notes.   63 year old female with recently developed iron deficient anemia, who was referred for IV iron.  She has had a EGD and a colonoscopy by Dr. Burnette, which were negative except hiatal hernia.  She may need capsule endoscopy.  Due to her upcoming knee surgery in 3 weeks, I recommend IV iron.   She has had low-dose IV iron in her PCPs office, which she tolerated well.  Will check which iron products is preferred by her insurance, and arrange additional 800 to 1000 mg p.o. iron in next 2 to 3 weeks.  Will repeat CBC and iron study after treatment.  Plan to follow-up her lab after surgery.  All questions were answered.  I spent a total of 30 minutes for her visit today.  Onita Lanny MD 05/17/2024

## 2024-05-17 NOTE — Assessment & Plan Note (Signed)
 The patient has medical history consistent with CREST autoimmune disorder. She was scheduled to have knee surgery earlier this year. When labs were checked for presurgical clearance, she was found to have anemia with Hgb of 9.8 and 33.6. this was on 04/19/2024. Her iron was 10 with iron saturation of 2%. The ferritin was 8. Since then, she has been on oral, liquid iron supplement. She has received 3 treatments with IV ferrlecit of 62.5 mg each. She also had endoscopy and colonoscopy one since then.  Both tests were done on 04/25/2024.  Endoscopy showed a small hiatal hernia.  She has LA grade a reflux esophagitis with no bleeding.  There was benign-appearing esophageal stenosis.  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  Her colonoscopy showed hemorrhoids on the perianal exam and internal hemorrhoids.  The examined ileum was normal.  There was diverticulitis in the descending colon and the transverse colon.  The colonoscopy was otherwise normal on direct and retroflexion views. Her colonoscopy report, per Dr. 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  She had a repeat CBC done on 05/09/2024 with her primary care provider after 3 treatments with IV Ferrlecit.  Hemoglobin was 10.2 and hematocrit was normal at 34.7.  She did receive an additional dose of IV Ferrlecit 62.5 mg since 05/09/2024. Her knee replacement surgery is now scheduled for 06/12/2024.  The goal is to get hemoglobin levels within normal limits.  She has had normal hemoglobin hematocrit in the past.  Ferritin has always been low.  Patient states when checked, her ferritin has always been <10.  Recommend patient continue with oral iron daily.  It is understood that she has been unable to tolerate doses higher than 12 mg daily.  Will redraw CBC, ferritin, iron panel, B12, and folate levels  today.  Based on these results, we will likely offer additional IV iron. The patient understands that IV iron treatments are administered at [facility]. IV iron does have minimal risk of negative side effects, mainly, allergic reactions. Reactions can consist of general pruritus and rash. Can include full anaphylactic reaction. I do give premedications (Tylenol and benadryl) to reduce risk of allergic reactions. The patient voiced understanding of all information and agrees to move forward with treatment.

## 2024-05-18 ENCOUNTER — Encounter: Payer: Self-pay | Admitting: Nurse Practitioner

## 2024-05-18 ENCOUNTER — Other Ambulatory Visit: Payer: Self-pay | Admitting: Nurse Practitioner

## 2024-05-21 ENCOUNTER — Other Ambulatory Visit (HOSPITAL_COMMUNITY): Payer: Self-pay | Admitting: Nurse Practitioner

## 2024-05-22 NOTE — Telephone Encounter (Addendum)
 Her insurance is [employer] (not the traditional [employer]) which does require auth.  It's processing and I will f/u once I have a response from the insurance.  Hopeful we will have a determination by Thursday/Friday.  Auth Submission: APPROVED Site of care: Site of care: [facility] Payer: [employer] Medication & CPT/J Code(s) submitted: Venofer (Iron Sucrose) J1756 Diagnosis Code:  Route of submission (phone, fax, portal):  Phone #[phone] Fax # Auth type: Buy/Bill PB Units/visits requested: 200MG  X6 DOSES Reference number: Auth # [id number]  Approval from: 05/23/24 to 11/20/24

## 2024-05-22 NOTE — Telephone Encounter (Signed)
 Hi Kim/Krista, I'm not sure if there is any additional information you can provide to the patient? Thanks!

## 2024-05-24 ENCOUNTER — Telehealth: Payer: Self-pay

## 2024-05-24 NOTE — Telephone Encounter (Signed)
 Patient is concerned about her Venofer prior auth. I called [employer] [phone] to follow up on the prior authorization that was submitted on 05/23/24 as urgent.  Auth # [id number] Prior shara is marked Urgent and the turnaround time is 05/27/24  Ref # for the call is [id number]  I called the patient and gave her this information. We will follow up again on Monday, 05/28/24 if we do not have a decision faxed to us .  She verbalized understanding while also stating that this is not what the insurance company told her. [employer] told her that it was not marked urgent, however I did confirm twice on the phone that it was marked urgent. She is concerned that she will not get the full treatment before her surgery at the end of November. I told her that the nurses are very good about trying to help the patients when they need urgent treatments and they would do everything they can. She verbalized appreciation.

## 2024-05-28 ENCOUNTER — Telehealth: Payer: Self-pay

## 2024-05-28 NOTE — Telephone Encounter (Signed)
 Called patient to schedule iron infusions due to insurance just approving medication. Patient states she is not going to get the infusions now because my hemoglobin is up to 12 and my surgeon says that is good enough for surgery. I will close referral.

## 2024-05-29 NOTE — Telephone Encounter (Signed)
 Venofer was approved and this was the response when Leita called her to schedule.....  Called patient to schedule iron infusions due to insurance just approving medication. Patient states she is not going to get the infusions now because my hemoglobin is up to 12 and my surgeon says that is good enough for surgery. I will close referral.

## 2024-06-01 ENCOUNTER — Encounter: Payer: Self-pay | Admitting: Nurse Practitioner

## 2024-06-04 ENCOUNTER — Telehealth: Payer: Self-pay | Admitting: Nurse Practitioner

## 2024-06-04 NOTE — Telephone Encounter (Signed)
 Pt  called in to cancel her appt. Stated that the provider is not need no more.

## 2024-06-08 ENCOUNTER — Inpatient Hospital Stay: Admitting: Nurse Practitioner

## 2024-06-08 ENCOUNTER — Inpatient Hospital Stay

## 2024-07-10 ENCOUNTER — Encounter: Payer: Self-pay | Admitting: Nurse Practitioner

## 2024-07-18 ENCOUNTER — Encounter: Payer: Self-pay | Admitting: Nurse Practitioner

## 2024-08-07 ENCOUNTER — Encounter (HOSPITAL_BASED_OUTPATIENT_CLINIC_OR_DEPARTMENT_OTHER): Payer: Self-pay

## 2024-08-07 ENCOUNTER — Emergency Department (HOSPITAL_BASED_OUTPATIENT_CLINIC_OR_DEPARTMENT_OTHER)

## 2024-08-07 ENCOUNTER — Emergency Department (HOSPITAL_BASED_OUTPATIENT_CLINIC_OR_DEPARTMENT_OTHER)
Admission: EM | Admit: 2024-08-07 | Discharge: 2024-08-07 | Disposition: A | Discharge status: Home / Self Care | Source: Ambulatory Visit | Attending: Emergency Medicine | Admitting: Emergency Medicine

## 2024-08-07 ENCOUNTER — Other Ambulatory Visit: Payer: Self-pay | Admitting: Gastroenterology

## 2024-08-07 ENCOUNTER — Encounter: Payer: Self-pay | Admitting: Nurse Practitioner

## 2024-08-07 ENCOUNTER — Other Ambulatory Visit: Payer: Self-pay

## 2024-08-07 DIAGNOSIS — R1032 Left lower quadrant pain: Secondary | ICD-10-CM

## 2024-08-07 DIAGNOSIS — Z79899 Other long term (current) drug therapy: Secondary | ICD-10-CM | POA: Insufficient documentation

## 2024-08-07 DIAGNOSIS — R197 Diarrhea, unspecified: Secondary | ICD-10-CM | POA: Diagnosis not present

## 2024-08-07 LAB — CBC
HCT: 38.8 % (ref 36.0–46.0)
Hemoglobin: 12.1 g/dL (ref 12.0–15.0)
MCH: 26.9 pg (ref 26.0–34.0)
MCHC: 31.2 g/dL (ref 30.0–36.0)
MCV: 86.2 fL (ref 80.0–100.0)
Platelets: 279 K/uL (ref 150–400)
RBC: 4.5 MIL/uL (ref 3.87–5.11)
RDW: 15.6 % — ABNORMAL HIGH (ref 11.5–15.5)
WBC: 3.8 K/uL — ABNORMAL LOW (ref 4.0–10.5)
nRBC: 0 % (ref 0.0–0.2)

## 2024-08-07 LAB — URINALYSIS, ROUTINE W REFLEX MICROSCOPIC
Bilirubin Urine: NEGATIVE
Glucose, UA: NEGATIVE mg/dL
Hgb urine dipstick: NEGATIVE
Ketones, ur: 15 mg/dL — AB
Leukocytes,Ua: NEGATIVE
Nitrite: NEGATIVE
Protein, ur: NEGATIVE mg/dL
Specific Gravity, Urine: >1.046 — ABNORMAL HIGH (ref 1.005–1.030)
pH: 5.5 (ref 5.0–8.0)

## 2024-08-07 LAB — COMPREHENSIVE METABOLIC PANEL WITH GFR
ALT: 18 U/L (ref 0–44)
AST: 20 U/L (ref 15–41)
Albumin: 4.5 g/dL (ref 3.5–5.0)
Alkaline Phosphatase: 95 U/L (ref 38–126)
Anion gap: 14 (ref 5–15)
BUN: 9 mg/dL (ref 8–23)
CO2: 23 mmol/L (ref 22–32)
Calcium: 11.5 mg/dL — ABNORMAL HIGH (ref 8.9–10.3)
Chloride: 105 mmol/L (ref 98–111)
Creatinine, Ser: 0.75 mg/dL (ref 0.44–1.00)
GFR, Estimated: >60 mL/min
Glucose, Bld: 91 mg/dL (ref 70–99)
Potassium: 4.4 mmol/L (ref 3.5–5.1)
Sodium: 141 mmol/L (ref 135–145)
Total Bilirubin: 0.3 mg/dL (ref 0.0–1.2)
Total Protein: 7.4 g/dL (ref 6.5–8.1)

## 2024-08-07 LAB — LIPASE, BLOOD: Lipase: 56 U/L — ABNORMAL HIGH (ref 11–51)

## 2024-08-07 MED ORDER — DICYCLOMINE HCL 20 MG PO TABS: 20.0000 mg | ORAL_TABLET | Freq: Three times a day (TID) | ORAL | 0 refills | Status: AC

## 2024-08-07 MED ORDER — DICYCLOMINE HCL 10 MG PO CAPS
10.0000 mg | ORAL_CAPSULE | Freq: Once | ORAL | Status: AC
  Administered 2024-08-07: 10 mg via ORAL
  Filled 2024-08-07: qty 1

## 2024-08-07 MED ORDER — DICYCLOMINE HCL 20 MG PO TABS: 20.0000 mg | ORAL_TABLET | Freq: Three times a day (TID) | ORAL | 0 refills | Status: DC

## 2024-08-07 MED ORDER — IOHEXOL 300 MG/ML  SOLN
100.0000 mL | Freq: Once | INTRAMUSCULAR | Status: AC | PRN
  Administered 2024-08-07: 100 mL via INTRAVENOUS

## 2024-08-07 NOTE — ED Triage Notes (Signed)
 Presents to ED from GI for possible diverticulitis. Pt c/o LLQ abdominal pain for a few days. Endorses diarrhea. Denies fevers, N/V. Been taking augmentin  since yesterday.

## 2024-08-07 NOTE — ED Provider Notes (Signed)
 " [facility] AT [facility] Provider Note   CSN: [id number] Arrival date & time: 08/07/24  1223     Patient presents with: Abdominal Pain   Paula [name] is a 64 y.o. female.    64 year old female presents with several days of left lower quadrant abdominal pain.  Has been doing some constipation from opiate use.  Denies any bloody stools.  No fever above 100.4.  Denies any urological or GYN complaints.  Called her gastro doctor, Dr. Burnette, prescribed her Augmentin  which she started taking yesterday.  Pain is persistent today.  Pain is characterized as dull and persistent in her left lower quadrant without radiation.  Was sent here for CAT scan       Prior to Admission medications  Medication Sig Start Date End Date Taking? Authorizing Provider  Ascorbic Acid (VITAMIN C) 1000 MG tablet Take 2,000 mg by mouth daily.     [provider]  Cholecalciferol (VITAMIN D  PO) Take 7,500 Units by mouth daily.     [provider]  doxycycline  (VIBRAMYCIN ) 50 MG capsule TAKE 1 CAPSULE BY MOUTH EVERY DAY WITH FOOD    [provider]  levothyroxine (SYNTHROID) 88 MCG tablet Take 1 tablet by mouth daily.  09/27/14   [provider]  liothyronine (CYTOMEL) 25 MCG tablet Take 25 mcg by mouth daily.     [provider]  NALTREXONE HCL PO Take 4.5 mg by mouth daily.    [provider]  Omega-3 Fatty Acids (FISH OIL) 1200 MG CAPS 1 capsule.    [provider]  omeprazole  (PRILOSEC) 20 MG capsule Take 20 mg by mouth daily.    [provider]  OVER THE COUNTER MEDICATION 12.5 mg daily.    [provider]  OVER THE COUNTER MEDICATION     [provider]  OVER THE COUNTER MEDICATION daily.    [provider]  PRESCRIPTION MEDICATION Compounded estrogen cream daily    [provider]  Probiotic Product (ADVANCED PROBIOTIC 10) CAPS Take by mouth.    [provider]   Probiotic Product (PROBIOTIC PO) Take by mouth daily.    [provider]  progesterone (PROMETRIUM) 100 MG capsule Take 100 mg by mouth daily.  12/05/17   [provider]  progesterone (PROMETRIUM) 100 MG capsule Take 100 mg by mouth at bedtime. 11/18/21   [provider]  valACYclovir (VALTREX) 500 MG tablet Take 500 mg by mouth 2 (two) times daily.    [provider]  vitamin E 400 UNIT capsule Take 800 Units by mouth daily.    [provider]    Allergies: Celebrex [celecoxib] and Vioxx [rofecoxib]    Review of Systems  All other systems reviewed and are negative.   Updated Vital Signs BP (!) 148/84 (BP Location: Right Arm)   Pulse 79   Temp 98.1 F (36.7 C)   Resp 18   Ht 1.702 m (5' 7)   Wt 83.9 kg   SpO2 100%   BMI 28.98 kg/m   Physical Exam Vitals and nursing note reviewed.  Constitutional:      General: She is not in acute distress.    Appearance: Normal appearance. She is well-developed. She is not toxic-appearing.  HENT:     Head: Normocephalic and atraumatic.  Eyes:     General: Lids are normal.     Conjunctiva/sclera: Conjunctivae normal.     Pupils: Pupils are equal, round, and reactive to light.  Neck:  Thyroid : No thyroid  mass.     Trachea: No tracheal deviation.  Cardiovascular:     Rate and Rhythm: Normal rate and regular rhythm.     Heart sounds: Normal heart sounds. No murmur heard.    No gallop.  Pulmonary:     Effort: Pulmonary effort is normal. No respiratory distress.     Breath sounds: Normal breath sounds. No stridor. No decreased breath sounds, wheezing, rhonchi or rales.  Abdominal:     General: There is no distension.     Palpations: Abdomen is soft.     Tenderness: There is abdominal tenderness in the left lower quadrant. There is no rebound.   Musculoskeletal:        General: No tenderness. Normal range of motion.     Cervical back: Normal range of motion and neck supple.  Skin:     General: Skin is warm and dry.     Findings: No abrasion or rash.  Neurological:     Mental Status: She is alert and oriented to person, place, and time. Mental status is at baseline.     GCS: GCS eye subscore is 4. GCS verbal subscore is 5. GCS motor subscore is 6.     Cranial Nerves: No cranial nerve deficit.     Sensory: No sensory deficit.     Motor: Motor function is intact.  Psychiatric:        Attention and Perception: Attention normal.        Speech: Speech normal.        Behavior: Behavior normal.     (all labs ordered are listed, but only abnormal results are displayed) Labs Reviewed  LIPASE, BLOOD - Abnormal; Notable for the following components:      Result Value   Lipase 56 (*)    All other components within normal limits  COMPREHENSIVE METABOLIC PANEL WITH GFR - Abnormal; Notable for the following components:   Calcium 11.5 (*)    All other components within normal limits  CBC - Abnormal; Notable for the following components:   WBC 3.8 (*)    RDW 15.6 (*)    All other components within normal limits  URINALYSIS, ROUTINE W REFLEX MICROSCOPIC    EKG: None  Radiology: No results found.   Procedures   Medications Ordered in the ED - No data to display                                  Medical Decision Making Amount and/or Complexity of Data Reviewed Labs: ordered. Radiology: ordered.   Labs here are overall reassuring.  Abdominal CT pending.  Signout to Dr. Ellouise     Final diagnoses:  None    ED Discharge Orders     None          Dasie Faden, MD 08/07/24 1510  "

## 2024-08-07 NOTE — ED Provider Notes (Signed)
 Patient signed out to me at 1500 by Dr. Dasie pending CTAP.  In short this is a 64 year old female who had a recent knee replacement and was on narcotics that has recently tapered presenting to the emergency department with abdominal pain.  She states that she initially was having generalized abdominal pain and constipation in the last 2 to 3 days has radiated to her left lower quadrant.  She was started on antibiotics by her gastroenterologist was recommended to come to the ED for further evaluation.  She states that she has had chills but no fever.  Patient is afebrile and hemodynamically stable here.  Her abdomen is soft and tender in the left lower quadrant.  Her labs showed mild leukopenia, a mildly elevated lipase, and mildly elevated calcium otherwise within normal range.  CTAP is pending at this time.  Patient will be given Bentyl  to help with her cramping symptoms.  Clinical Course as of 08/07/24 1947  Tue Aug 07, 2024  1713 Mildly dehydrated urine, no signs of infection.  [VK]  1849 CTAP with small amount of air within the bladder concerning for cystitis vs enterovesical fistula. No evidence of diverticulitis. UA is negative for UTI.  [VK]  1946 Patient does not know if she had a foley catheter during her procedure but will follow up with her surgeon to find out and otherwise was recommended outpatient work up for possible fistula. Will continue bentyl  for cramping and spasms and given strict return precautions. [VK]    Clinical Course User Index [VK] Kingsley, Zuri Lascala K, DO      Kingsley, Neville Pauls K, [place] 08/07/24 [phone]

## 2024-08-07 NOTE — Discharge Instructions (Signed)
 You were seen in the emergency department for your abdominal pain.  Your workup did not show that you have diverticulitis.  It incidentally showed that you have a small amount of air in your bladder which could be related to a fistula if you did not have a recent catheter placed during your surgery.  You can follow-up with your primary doctor and with general surgery for further workup and evaluation.  Your stomach pain is likely related to the medications that you were on and you can take Bentyl  before meals to help with stomach cramping.  You should return to the emergency department if you have fevers, significantly worsening pain, repetitive vomiting or any other new or concerning symptoms.

## 2024-08-08 ENCOUNTER — Other Ambulatory Visit

## 2024-11-02 ENCOUNTER — Ambulatory Visit: Admitting: Diagnostic Neuroimaging
